# Patient Record
Sex: Female | Born: 1975 | Race: Black or African American | Hispanic: No | Smoking: Never smoker
Health system: Southern US, Community
[De-identification: ages and names within clinical notes are randomized; demographics above are authoritative.]

## PROBLEM LIST (undated history)

## (undated) ENCOUNTER — Inpatient Hospital Stay (HOSPITAL_COMMUNITY): Payer: Self-pay

## (undated) DIAGNOSIS — R519 Headache, unspecified: Secondary | ICD-10-CM

## (undated) DIAGNOSIS — R51 Headache: Secondary | ICD-10-CM

## (undated) DIAGNOSIS — N979 Female infertility, unspecified: Secondary | ICD-10-CM

## (undated) DIAGNOSIS — Z973 Presence of spectacles and contact lenses: Secondary | ICD-10-CM

## (undated) DIAGNOSIS — D649 Anemia, unspecified: Secondary | ICD-10-CM

## (undated) DIAGNOSIS — O021 Missed abortion: Secondary | ICD-10-CM

## (undated) DIAGNOSIS — A4902 Methicillin resistant Staphylococcus aureus infection, unspecified site: Secondary | ICD-10-CM

## (undated) DIAGNOSIS — B999 Unspecified infectious disease: Secondary | ICD-10-CM

## (undated) DIAGNOSIS — N7093 Salpingitis and oophoritis, unspecified: Secondary | ICD-10-CM

## (undated) HISTORY — PX: SALPINGOOPHORECTOMY: SHX82

## (undated) HISTORY — PX: WISDOM TOOTH EXTRACTION: SHX21

---

## 2011-11-17 ENCOUNTER — Ambulatory Visit (INDEPENDENT_AMBULATORY_CARE_PROVIDER_SITE_OTHER): Payer: BC Managed Care – PPO | Admitting: Medical

## 2011-11-17 ENCOUNTER — Encounter: Payer: Self-pay | Admitting: Medical

## 2011-11-17 VITALS — BP 110/70 | HR 68 | Temp 98.1°F | Ht 65.0 in | Wt 187.0 lb

## 2011-11-17 DIAGNOSIS — L089 Local infection of the skin and subcutaneous tissue, unspecified: Secondary | ICD-10-CM | POA: Insufficient documentation

## 2011-11-17 DIAGNOSIS — L723 Sebaceous cyst: Secondary | ICD-10-CM

## 2011-11-17 MED ORDER — CHLORHEXIDINE GLUCONATE 4 % EX SOLN: 1.0000 "application " | CUTANEOUS | Status: DC

## 2011-11-17 MED ORDER — DOXYCYCLINE HYCLATE 100 MG PO TABS: 100.0000 mg | ORAL_TABLET | Freq: Two times a day (BID) | ORAL | Status: AC

## 2011-11-17 NOTE — Progress Notes (Signed)
Subjective:   HPI  Kathryn Lara is a 36 y.o. female who presents as a new patient today.  She is here with her husband today.  She mainly has one complaint today and that is a possible boil under her right arm.  She notes history of prior boil that was positive for MRSA in the remote past.  This boil ended up rupturing on its own.  The current lesion has been there about 2 weeks but is worsening, is hard, tender, but no warmth or drainage.   No other aggravating or relieving factors.  She denies breast lesions, does check her breasts regularly, no concerns for breast lump.  No other c/o.  The following portions of the patient's history were reviewed and updated as appropriate: allergies, current medications, past family history, past medical history, past social history, past surgical history and problem list.  History reviewed. No pertinent past medical history.  Allergies  Allergen Reactions  . Septra (Bactrim) Hives    No current outpatient prescriptions on file prior to visit.     Review of Systems Constitutional: denies fever, chills, sweats, unexpected weight change Gastroenterology: denies abdominal pain, nausea, vomiting, diarrhea, constipation Hematology: denies bleeding or bruising problems Musculoskeletal: denies arthralgias, myalgias, joint swelling, back pain Neurology: no headache, weakness, tingling, numbness      Objective:   Physical Exam  General appearance: alert, no distress, WD/WN, black female Right axilla anteriorly with small 1 cm somewhat round/oval nodule that seems cystlike, but may have slight fluctuance in the middle. The area seems slightly indurated, tender, but no warmth or erythema.  Assessment and Plan :     Encounter Diagnosis  Name Primary?  . Infected cyst of skin Yes    The current lesion of concern is small but tender. Differential includes early abscess versus infected cyst versus other lump.  Given her history of MRSA infection,  we will treat empirically for an infected cyst. Prescription today for doxycycline, refilled Hibiclens wash that she has used in the past.  Discussed possible signs that would indicate worsening infection. She will call return if worse. Likewise, if this lesion is just not healing or getting bigger she will call or return.

## 2011-11-17 NOTE — Patient Instructions (Signed)
Abscess An abscess (boil or furuncle) is an infected area that contains a collection of pus.  SYMPTOMS Signs and symptoms of an abscess include pain, tenderness, redness, or hardness. You may feel a moveable soft area under your skin. An abscess can occur anywhere in the body.  TREATMENT  A surgical cut (incision) may be made over your abscess to drain the pus. Gauze may be packed into the space or a drain may be looped through the abscess cavity (pocket). This provides a drain that will allow the cavity to heal from the inside outwards. The abscess may be painful for a few days, but should feel much better if it was drained.  Your abscess, if seen early, may not have localized and may not have been drained. If not, another appointment may be required if it does not get better on its own or with medications. HOME CARE INSTRUCTIONS   Only take over-the-counter or prescription medicines for pain, discomfort, or fever as directed by your caregiver.   Take your antibiotics as directed if they were prescribed. Finish them even if you start to feel better.   Keep the skin and clothes clean around your abscess.   If the abscess was drained, you will need to use gauze dressing to collect any draining pus. Dressings will typically need to be changed 3 or more times a day.   The infection may spread by skin contact with others. Avoid skin contact as much as possible.   Practice good hygiene. This includes regular hand washing, cover any draining skin lesions, and do not share personal care items.   If you participate in sports, do not share athletic equipment, towels, whirlpools, or personal care items. Shower after every practice or tournament.   If a draining area cannot be adequately covered:   Do not participate in sports.   Children should not participate in day care until the wound has healed or drainage stops.   If your caregiver has given you a follow-up appointment, it is very important  to keep that appointment. Not keeping the appointment could result in a much worse infection, chronic or permanent injury, pain, and disability. If there is any problem keeping the appointment, you must call back to this facility for assistance.  SEEK MEDICAL CARE IF:   You develop increased pain, swelling, redness, drainage, or bleeding in the wound site.   You develop signs of generalized infection including muscle aches, chills, fever, or a general ill feeling.   You have an oral temperature above 102 F (38.9 C).  MAKE SURE YOU:   Understand these instructions.   Will watch your condition.   Will get help right away if you are not doing well or get worse.  Document Released: 07/15/2005 Document Revised: 06/17/2011 Document Reviewed: 05/08/2008 [facility] Patient Information 2012 [place], [place].  CELLULITIS Cellulitis is an infection of the skin and the tissue beneath it. The area is typically red and tender. It is caused by germs (bacteria) (usually staph or strep) that enter the body through cuts or sores. Cellulitis most commonly occurs in the arms or lower legs.  HOME CARE INSTRUCTIONS   If you are given a prescription for medications which kill germs (antibiotics), take as directed until finished.   If the infection is on the arm or leg, keep the limb elevated as able.   Use a warm cloth several times per day to relieve pain and encourage healing.   See your caregiver for recheck of the infected  site as directed if problems arise.   Only take over-the-counter or prescription medicines for pain, discomfort, or fever as directed by your caregiver.  SEEK MEDICAL CARE IF:   The area of redness (inflammation) is spreading, there are red streaks coming from the infected site, or if a part of the infection begins to turn dark in color.   The joint or bone underneath the infected skin becomes painful after the skin has healed.   The infection returns in the same or another area  after it seems to have gone away.   A boil or bump swells up. This may be an abscess.   New, unexplained problems such as pain or fever develop.  SEEK IMMEDIATE MEDICAL CARE IF:   You have a fever.   You or your child feels drowsy or lethargic.   There is vomiting, diarrhea, or lasting discomfort or feeling ill (malaise) with muscle aches and pains.  MAKE SURE YOU:   Understand these instructions.   Will watch your condition.   Will get help right away if you are not doing well or get worse.  Document Released: 07/15/2005 Document Revised: 06/17/2011 Document Reviewed: 05/23/2008 [facility] Patient Information 2012 [place], [place].

## 2012-02-26 ENCOUNTER — Other Ambulatory Visit: Payer: Self-pay | Admitting: Medical

## 2012-02-26 ENCOUNTER — Ambulatory Visit (INDEPENDENT_AMBULATORY_CARE_PROVIDER_SITE_OTHER): Payer: BC Managed Care – PPO | Admitting: Medical

## 2012-02-26 ENCOUNTER — Encounter: Payer: Self-pay | Admitting: Medical

## 2012-02-26 VITALS — BP 100/68 | HR 68 | Temp 97.7°F | Resp 16 | Wt 200.0 lb

## 2012-02-26 DIAGNOSIS — IMO0002 Reserved for concepts with insufficient information to code with codable children: Secondary | ICD-10-CM

## 2012-02-26 DIAGNOSIS — L02411 Cutaneous abscess of right axilla: Secondary | ICD-10-CM

## 2012-02-26 MED ORDER — DOXYCYCLINE HYCLATE 100 MG PO TABS: 100.0000 mg | ORAL_TABLET | Freq: Two times a day (BID) | ORAL | Status: AC

## 2012-02-26 MED ORDER — HYDROCODONE-ACETAMINOPHEN 5-500 MG PO TABS: 1.0000 | ORAL_TABLET | Freq: Four times a day (QID) | ORAL | Status: AC | PRN

## 2012-02-26 NOTE — Progress Notes (Signed)
   Kathryn Lara is a 36 y.o. female who presents for evaluation of a probable cutaneous abscess. Lesion is located in the right axilla. Onset was a few days ago. Symptoms have gradually worsened.  Saw me for similar few months ago.  Abscess has associated symptoms of nausea. 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 Patient does not have diabetes.   Objective:    There is an area characterized by a soft mobile subQ mass, induration, fluctuance, tenderness measuring 3 cm in greatest dimension. Location: right axilla, superolateral breast border.  Procedure Informed consent obtained.  The area was prepped in the usual manner and the skin overlying the abscess was anesthetized with 2.5cc of 1% lidocaine with epinephrine.  The area was sharply incised and approx 3ccs of purulent material was obtained.  Area was irrigated with high pressure saline. Packing was inserted. Wound was covered with sterile bandage.      Assessment:   Encounter Diagnosis  Name Primary?  [name] Abscess of axilla, right Yes     Plan:    Apply hot compresses frequently to promote drainage.  Wound culture sent. Oral antibiotics -- see med orders. I & D procedure as above. RTC in 3 days or PRN.

## 2012-02-29 ENCOUNTER — Encounter: Payer: Self-pay | Admitting: Medical

## 2012-02-29 ENCOUNTER — Ambulatory Visit (INDEPENDENT_AMBULATORY_CARE_PROVIDER_SITE_OTHER): Payer: BC Managed Care – PPO | Admitting: Medical

## 2012-02-29 VITALS — BP 120/70 | HR 68 | Temp 98.5°F | Resp 16 | Wt 203.0 lb

## 2012-02-29 DIAGNOSIS — IMO0002 Reserved for concepts with insufficient information to code with codable children: Secondary | ICD-10-CM

## 2012-02-29 DIAGNOSIS — L02419 Cutaneous abscess of limb, unspecified: Secondary | ICD-10-CM

## 2012-02-29 NOTE — Progress Notes (Signed)
Subjective:  Kathryn Lara is a 36 y.o. female who presents for recheck and packing removal.  I saw her Friday for right axillary abscess.  She is using the Doxycyline, having some nausea with the doxycyline though.  Feels improved some, changing dressing regularly.  Using the pain medication some.  No new c/o.    Objective:   Right axilla with 1cm oval area of induration, some tenderness, packing in place, slight drainage.  No erythema, no warmth.    Assessment:   Encounter Diagnosis  Name Primary?  [name] Abscess of axilla Yes     Plan:   Removed packing.  Improving.  Finish Doxycycline, advised c/t hot compresses frequently to promote drainage, Lortab prn, call if not resolving.  Still pending culture

## 2012-03-02 ENCOUNTER — Other Ambulatory Visit: Payer: Self-pay | Admitting: Medical

## 2012-03-02 LAB — WOUND CULTURE: Gram Stain: NONE SEEN

## 2012-03-02 MED ORDER — AMOXICILLIN-POT CLAVULANATE 875-125 MG PO TABS: 1.0000 | ORAL_TABLET | Freq: Two times a day (BID) | ORAL | Status: AC

## 2012-05-31 ENCOUNTER — Encounter: Payer: Self-pay | Admitting: Medical

## 2012-05-31 ENCOUNTER — Ambulatory Visit (INDEPENDENT_AMBULATORY_CARE_PROVIDER_SITE_OTHER): Payer: BC Managed Care – PPO | Admitting: Medical

## 2012-05-31 VITALS — BP 120/70 | HR 80 | Temp 98.3°F | Resp 16 | Wt 199.0 lb

## 2012-05-31 DIAGNOSIS — L2089 Other atopic dermatitis: Secondary | ICD-10-CM

## 2012-05-31 DIAGNOSIS — D229 Melanocytic nevi, unspecified: Secondary | ICD-10-CM

## 2012-05-31 DIAGNOSIS — D239 Other benign neoplasm of skin, unspecified: Secondary | ICD-10-CM

## 2012-05-31 DIAGNOSIS — L209 Atopic dermatitis, unspecified: Secondary | ICD-10-CM

## 2012-05-31 MED ORDER — TRIAMCINOLONE ACETONIDE 0.1 % EX CREA: TOPICAL_CREAM | Freq: Two times a day (BID) | CUTANEOUS | Status: AC

## 2012-05-31 NOTE — Progress Notes (Signed)
Subjective:   HPI  Kathryn Lara is a 36 y.o. female who presents for rash and moles.  She notes several day hx/o rash on neck, its itchy.  She is not sure what is triggering the rash, but wonders if it is the lab coat at work.  Works as a Water quality scientist.  The Dispensing optician don't seem to aggravate her but the laundered cloth ones do.  She has dreadlocks in the back of her hair and uses "natural" sprays of lavender and other sprays on the hair.  In general uses dove or caress soap for hygiene.   She notes no prior similar rash.   Using calamine lotion on the rash.  She also has several moles on her legs she wants me to look at.  None with recent growth or major changes.  No other aggravating or relieving factors.    No other c/o.  The following portions of the patient's history were reviewed and updated as appropriate: allergies, current medications, past family history, past medical history, past social history, past surgical history and problem list.  No past medical history on file.  Allergies  Allergen Reactions  . Septra (Bactrim) Hives     Review of Systems ROS reviewed and was negative other than noted in HPI or above.    Objective:   Physical Exam  General appearance: alert, no distress, WD/WN Skin: back of neck and lateral neck to lesser degree with rough, aggravated skin, a few whealed lesions, but mostly rough raised irregular rash, scattered suggestive of atopic dermatitis.  No erythema or scaling, no warmth, induration or fluctuance.    Lower legs, mostly left sided anteriorly and top of left foot with scattered flat, brown, mostly round, well defined lesions, mostly 2-4 mm diameter.  No particularly worrisome lesions.  The left dorsal foot lesion is similar but with some scaling.     Assessment and Plan :     Encounter Diagnoses  Name Primary?  [name] Atopic dermatitis Yes  . Skin moles    Atopic dermatitis - likely aggravated by the hair sprays or lab coat.  I  advised she stop the hair sprays for now, consider c/t using the disposable lab coats for now, begin daily moisturizing lotion, and if not improving, use the triamcinolone cream prescribed today.  Keep the neck clean, washing BID with mild soap and water.  Call/return if not resolving within a week or 2.   Skin moles - discussed the ABCDs of moles.  None worrisome today.  She will let me know if changing skin lesions.

## 2012-05-31 NOTE — Patient Instructions (Signed)
Begin a daily moisturizing lotion/emollient lotion on skin, particularly neck, legs and feet.   Wash the neck twice with mild soap and water.   Avoid the sprays on the hair for now to see if rash resolves.   If rash not improving in the next few days with the moisturizing lotion, then begin the steroid cream Triamcinolone.    Mole Moles are usually harmless skin spots. Moles can be different colors, from light brown to black. People usually develop many of them over their lifetime. Moles increase in number during the first 3 decades of life. Moles can be anywhere on the body. They may be flat or raised and sometimes they contain hair. Although moles can change over time, they only rarely turn into skin cancer. The moles that do not turn into cancer (benign) are usually:  Small (the size of a pencil eraser or less).   One color.   Smooth.   Have a uniform border.   Round and oval and do not change much in appearance over time.  Malignant melanoma is a skin cancer that starts like a mole. Moles that were present at birth are more likely to turn into a melanoma later in life especially if they were very large (larger than 7 inches [20 cm] in diameter) at birth. Melanomas are different from moles. Melanomas:  Have Borders that are more ragged.   Have more than one color (red, white or blue) in addition to brown or black.   Are usually bigger.  Years of sun exposure increases the risk for getting melanoma. It is important to look at your moles regularly so that you can notice any changes that may occur within anyone of them that make it different from your other moles on your body. Know the A, B, C, D's of your moles - a mole is more worrisome for a melanoma if: A: It is Asymmetrical (one half is different from the other half). B: The Borders are jagged, etched or irregular. C: The Color is uneven (shades of brown, tan and/or black).  D: The Diameter is greater than a quarter of an inch (6  mm). SEEK MEDICAL CARE IF:  Your mole bleeds or becomes an open sore or does not heal.   Your mole grows rapidly or changes color.   Your mole becomes irregular and bumpy.   Your mole becomes painful, itchy, tender or sensitive.  Document Released: 11/12/2004 Document Revised: 09/24/2011 Document Reviewed: 09/21/2008 [facility] Patient Information 2012 [place], [place].

## 2012-11-21 ENCOUNTER — Ambulatory Visit (INDEPENDENT_AMBULATORY_CARE_PROVIDER_SITE_OTHER): Payer: Self-pay | Admitting: Medical

## 2012-11-21 ENCOUNTER — Encounter: Payer: Self-pay | Admitting: Medical

## 2012-11-21 ENCOUNTER — Encounter: Payer: Self-pay | Admitting: Family Medicine

## 2012-11-21 VITALS — BP 100/60 | HR 68 | Temp 98.6°F | Resp 16 | Wt 181.0 lb

## 2012-11-21 DIAGNOSIS — R55 Syncope and collapse: Secondary | ICD-10-CM

## 2012-11-21 LAB — POCT URINE PREGNANCY: Preg Test, Ur: NEGATIVE

## 2012-11-21 LAB — POCT URINALYSIS DIPSTICK
Glucose, UA: NEGATIVE
Leukocytes, UA: NEGATIVE
Nitrite, UA: NEGATIVE
Urobilinogen, UA: NEGATIVE

## 2012-11-21 LAB — CBC WITH DIFFERENTIAL/PLATELET
Basophils Absolute: 0 10*3/uL (ref 0.0–0.1)
Basophils Relative: 1 % (ref 0–1)
Eosinophils Absolute: 0.3 10*3/uL (ref 0.0–0.7)
Eosinophils Relative: 3 % (ref 0–5)
HCT: 37.2 % (ref 36.0–46.0)
Hemoglobin: 12.5 g/dL (ref 12.0–15.0)
MCH: 29.8 pg (ref 26.0–34.0)
MCHC: 33.6 g/dL (ref 30.0–36.0)
Monocytes Absolute: 0.5 10*3/uL (ref 0.1–1.0)
Monocytes Relative: 6 % (ref 3–12)
RDW: 13.9 % (ref 11.5–15.5)

## 2012-11-21 LAB — TSH: TSH: 0.512 u[IU]/mL (ref 0.350–4.500)

## 2012-11-21 LAB — T3 UPTAKE: T3 Uptake: 36.9 % (ref 22.5–37.0)

## 2012-11-21 LAB — T4: T4, Total: 7.6 ug/dL (ref 5.0–12.5)

## 2012-11-21 NOTE — Progress Notes (Signed)
Subjective:  Kathryn Lara is a 37 y.o. female who presents for c/o "fainting."  Had been in usual state of health yesterday until the evening.  She notes that she fainted yesterday evening about 7pm.   She had been standing, felt a little nauseated and dizzy, tried to go sit down but didn't make.  She was at aunts house when this happened, at a [employer] party.  Brother in Social worker and sister in law witnessed.  People that witnessed the event said she fell down on her back, was "out" 30-60 seconds, made a snoring noise, was sweaty and when she came to was nauseated and sweaty.  They took her to a chair to rest.   EMS was called, EMS came out, checked BP and glucose, said her glucose was ok, but advised that her BP dropped with orthostatic vitals.   They advised her not to drive and come to be evaluated by primary provider.  Before the faint, she felt nauseated and light headed.   She had been drinking some wine prior.  She had eaten about 15 minutes prior to this event, but had only eaten a few crackers and some water the rest of the day prior.  The people witnessed said no seizure.  She does not remember anything during the 30-60 seconds.  She did not quit breathing when out.  Denies head injury or other pains today.   Denies prior similar problem.  She is on her period, not sure if this plays a role.  LMP prior was 10/23/12. She and husband are trying to get pregnant.  No other aggravating or relieving factors.    No other c/o.  The following portions of the patient's history were reviewed and updated as appropriate: allergies, current medications, past family history, past medical history, past social history, past surgical history and problem list.  No past medical history on file.  Review of Systems Constitutional: -fever, -chills, +sweats, -unexpected weight change,-fatigue ENT: -runny nose, -ear pain, -sore throat Cardiology:  -chest pain, -palpitations, -edema Respiratory: -cough, -shortness  of breath, -wheezing Gastroenterology: -abdominal pain, +nausea, -vomiting, -diarrhea, -constipation  Hematology: -bleeding or bruising problems Musculoskeletal: -arthralgias, -myalgias, -joint swelling, -back pain Ophthalmology: -vision changes Urology: -dysuria, -difficulty urinating, -hematuria, -urinary frequency, -urgency Neurology: -headache, -weakness, -tingling, -numbness    Objective: Physical Exam  Vital signs reviewed  General appearance: alert, no distress, WD/WN, AA female HEENT: normocephalic, sclerae anicteric, conjunctiva pink and moist, TMs pearly, nares patent, no discharge or erythema, pharynx normal Oral cavity: MMM, no lesions Neck: supple, no lymphadenopathy, no thyromegaly, no masses, no bruits Heart: RRR, normal S1, S2, no murmurs Lungs: CTA bilaterally, no wheezes, rhonchi, or rales Abdomen: +bs, soft, non tender, non distended, no masses, no hepatomegaly, no splenomegaly Pulses: 2+ radial pulses, 2+ pedal pulses, normal cap refill Ext: no edema Neuro: CN2-12 intact, A&O x3, romberg negative, heel to toe normal, no cerebellar signs, DTRs, strength and sensation normal.  nonfocal  Orthostatic vitals normal today.   Adult ECG Report  Indication: syncope  Rate: 51 bpm  Rhythm: sinus bradycardia  QRS Axis: -4 degrees  PR Interval:  QRS Duration: 78ms  QTc:  Conduction Disturbances: sinus arrhythmia  Other Abnormalities: none  Patient's cardiac risk factors are: obesity (BMI >= 30 kg/m2).  EKG comparison: none  Narrative Interpretation: sinus bradycardia with sinus arrhythmia   Assessment: Encounter Diagnosis  Name Primary?  . Syncope Yes    Plan: Discussed symptoms.  Episodes seems most consistent with vasovagal  syncope, although the only trigger may be from hypovolemia given that she had not had much to eat or drink.   Glucose was reportedly normal.  No obvious seizure.  At this point I reviewed her EKG here today, and we will get  some baseline labs to rule out other causes.  Advised she hydrate well daily, 5-6 glasses of water daily, don't skip meals, eat protein and carbs tthroughoutthe day.  F/u ppendinglabs.   Advised she not drive until we have results.   Gave note for work.

## 2012-11-22 LAB — COMPREHENSIVE METABOLIC PANEL
BUN: 11 mg/dL (ref 6–23)
CO2: 28 mEq/L (ref 19–32)
Creat: 0.76 mg/dL (ref 0.50–1.10)
Glucose, Bld: 85 mg/dL (ref 70–99)
Total Bilirubin: 0.6 mg/dL (ref 0.3–1.2)
Total Protein: 6.5 g/dL (ref 6.0–8.3)

## 2012-11-22 LAB — LIPID PANEL
Cholesterol: 125 mg/dL (ref 0–200)
LDL Cholesterol: 70 mg/dL (ref 0–99)
Triglycerides: 42 mg/dL (ref <150)

## 2012-11-25 NOTE — Addendum Note (Signed)
Addended by: Jac Canavan on: 11/25/2012 07:38 PM   Modules accepted: Orders

## 2012-11-28 ENCOUNTER — Telehealth: Payer: Self-pay | Admitting: Family Medicine

## 2012-11-28 NOTE — Telephone Encounter (Signed)
I just spoke with the patient today. The answers to the question is in your box. CLS

## 2012-11-28 NOTE — Telephone Encounter (Signed)
Message copied by Janeice Robinson on Mon Nov 28, 2012 12:38 PM ------      Message from: Jac Canavan      Created: Fri Nov 25, 2012  7:40 PM       Did you get a hold of her?   Did you see the prior msg?  i haven't heard back?              Pending those questions, I may refer to cardiology given EKG findings, syncope symptom ------

## 2012-11-29 NOTE — Telephone Encounter (Signed)
Her faint episode is still somewhat unexplained.  This may have just been due to vasovagal event and dehydration since she had not drank or had eaten much all day that day.  However, I can't rule out other causes.  I am glad she has had no other events.   Nevertheless, given the syncope episode, I would recommend a referral to cardiology to evaluate this further.   She has a slower than normal heart rate and abnormal EKG.  Holter testing would be appropriate to evaluate for abnormal rhythms of the heart.  If agreeable refer.

## 2012-11-29 NOTE — Telephone Encounter (Signed)
Per the patients request, I left a detailed message on her voice mail in reference to Kristian Covey PA-C recommendations for her care. CLS

## 2013-06-28 ENCOUNTER — Encounter: Payer: Self-pay | Admitting: Medical

## 2013-06-28 ENCOUNTER — Ambulatory Visit (INDEPENDENT_AMBULATORY_CARE_PROVIDER_SITE_OTHER): Payer: 59 | Admitting: Medical

## 2013-06-28 VITALS — BP 110/70 | HR 68 | Temp 98.0°F | Resp 16 | Wt 172.0 lb

## 2013-06-28 DIAGNOSIS — K921 Melena: Secondary | ICD-10-CM

## 2013-06-28 DIAGNOSIS — R350 Frequency of micturition: Secondary | ICD-10-CM

## 2013-06-28 DIAGNOSIS — K5900 Constipation, unspecified: Secondary | ICD-10-CM

## 2013-06-28 DIAGNOSIS — K649 Unspecified hemorrhoids: Secondary | ICD-10-CM

## 2013-06-28 LAB — POCT URINALYSIS DIPSTICK
Protein, UA: NEGATIVE
Spec Grav, UA: 1.02
Urobilinogen, UA: NEGATIVE

## 2013-06-28 MED ORDER — HYDROCORTISONE 2.5 % RE CREA: TOPICAL_CREAM | Freq: Two times a day (BID) | RECTAL | Status: DC

## 2013-06-28 NOTE — Progress Notes (Signed)
Subjective: Here for c/o blood in stool . She notes several times prior has seen blood in stool, minimal bright red amounts.   None in a while until today.   This morning felt pressure to defecate, but no stool came out, just noticed bright red blood on toilet paper.  She saw none in the toilet or mixed in with stool.  otherwise she is in her normal state of health.  She notes having BM daily, had BM last night, but often has to strain or push hard.  Sometimes stool is like pellets, sometimes solid, other times soft.  She drinks some water, but probably "not enough."  She is not sure about her fiber intake.  In the past has used miralax, has seen doctor for this prior.  Often gets abdominal cramps, burning in stomach, sometimes nausea, frequent belching.  She is trying to get pregnant, taking prenatal vitamins.  MGF had stomach issues, but no prior endoscopies, no other family hx/o bowel issues.   History reviewed. No pertinent past medical history. Family History  Problem Relation Age of Onset  . Heart disease Neg Hx   . Stroke Neg Hx    ROS as in subjective  Objective: Filed Vitals:   06/28/13 1504  BP: 110/70  Pulse: 68  Temp:   Resp:    Reviewed orthostatic vital signs.  General appearance: alert, no distress, WD/W Neck: supple, no lymphadenopathy, no thyromegaly, no masses Heart: RRR, normal S1, S2, no murmurs Lungs: CTA bilaterally, no wheezes, rhonchi, or rales Abdomen: +bs, soft, non tender, non distended, no masses, no hepatomegaly, no splenomegaly Pulses: 2+ symmetric Ext: no edema DRE: anus with posterior moderate size hemorrhoid and smaller anterior hemorrhoid, non thrombosed, anus normal tone, palpable internal hemorrhoids, and +hemoccult stool.  Exam chaperoned by nurse   Assessment: Encounter Diagnoses  Name Primary?  . Blood in stool Yes  . Hemorrhoid   . Urinary frequency   . Unspecified constipation      Plan: Orthostatics unremarkable.  Given the one  small sighting of blood and +exam with hemorrhoids and occult +blood, I suspect only hemorrhoid etiology.   At this point advised increased fiber and water intake, can use short term Proctosol HC cream for now, sitz baths if needed, consider daily fiber supplement.  Follow-up if continued bleeding, worse symptoms, otherwise f/u20mo

## 2013-06-29 ENCOUNTER — Telehealth: Payer: Self-pay | Admitting: Internal Medicine

## 2013-06-29 NOTE — Telephone Encounter (Signed)
Message copied by Joslyn Hy on Thu Jun 29, 2013 10:05 AM ------      Message from: Jac Canavan      Created: Wed Jun 28, 2013  9:21 PM       Have her f/u in 66mo if any continued blood in stool or no improvement despite the recommendations today ------

## 2013-06-29 NOTE — Telephone Encounter (Signed)
Called and left message on pt vm that if she is still having trouble and no improvement to follow-up in 1 month

## 2014-03-09 HISTORY — PX: OTHER SURGICAL HISTORY: SHX169

## 2014-04-13 ENCOUNTER — Encounter (HOSPITAL_BASED_OUTPATIENT_CLINIC_OR_DEPARTMENT_OTHER): Payer: Self-pay | Admitting: *Deleted

## 2014-04-16 ENCOUNTER — Encounter (HOSPITAL_BASED_OUTPATIENT_CLINIC_OR_DEPARTMENT_OTHER): Payer: Self-pay | Admitting: *Deleted

## 2014-04-16 NOTE — Progress Notes (Signed)
NPO AFTER MN.  ARRIVE AT 1000.  NEEDS HG.  

## 2014-04-17 ENCOUNTER — Encounter (HOSPITAL_BASED_OUTPATIENT_CLINIC_OR_DEPARTMENT_OTHER)
Admission: RE | Disposition: A | Discharge status: Home / Self Care | Payer: Self-pay | Source: Ambulatory Visit | Attending: Obstetrics and Gynecology

## 2014-04-17 ENCOUNTER — Encounter (HOSPITAL_BASED_OUTPATIENT_CLINIC_OR_DEPARTMENT_OTHER): Payer: Self-pay | Admitting: Anesthesiology

## 2014-04-17 ENCOUNTER — Encounter (HOSPITAL_BASED_OUTPATIENT_CLINIC_OR_DEPARTMENT_OTHER): Payer: BC Managed Care – PPO | Admitting: Anesthesiology

## 2014-04-17 ENCOUNTER — Ambulatory Visit (HOSPITAL_BASED_OUTPATIENT_CLINIC_OR_DEPARTMENT_OTHER)
Admission: RE | Admit: 2014-04-17 | Discharge: 2014-04-17 | Disposition: A | Discharge status: Home / Self Care | Payer: BC Managed Care – PPO | Source: Ambulatory Visit | Attending: Obstetrics and Gynecology | Admitting: Obstetrics and Gynecology

## 2014-04-17 ENCOUNTER — Ambulatory Visit (HOSPITAL_BASED_OUTPATIENT_CLINIC_OR_DEPARTMENT_OTHER): Payer: BC Managed Care – PPO | Admitting: Anesthesiology

## 2014-04-17 DIAGNOSIS — O021 Missed abortion: Secondary | ICD-10-CM | POA: Insufficient documentation

## 2014-04-17 DIAGNOSIS — Z87891 Personal history of nicotine dependence: Secondary | ICD-10-CM | POA: Insufficient documentation

## 2014-04-17 DIAGNOSIS — Z882 Allergy status to sulfonamides status: Secondary | ICD-10-CM | POA: Insufficient documentation

## 2014-04-17 DIAGNOSIS — Z79899 Other long term (current) drug therapy: Secondary | ICD-10-CM | POA: Insufficient documentation

## 2014-04-17 HISTORY — PX: DILATION AND CURETTAGE OF UTERUS: SHX78

## 2014-04-17 HISTORY — DX: Missed abortion: O02.1

## 2014-04-17 HISTORY — DX: Female infertility, unspecified: N97.9

## 2014-04-17 HISTORY — DX: Presence of spectacles and contact lenses: Z97.3

## 2014-04-17 LAB — POCT HEMOGLOBIN-HEMACUE: Hemoglobin: 11.2 g/dL — ABNORMAL LOW (ref 12.0–15.0)

## 2014-04-17 SURGERY — DILATION AND CURETTAGE
Anesthesia: General | Site: Vagina

## 2014-04-17 MED ORDER — LIDOCAINE HCL (CARDIAC) 20 MG/ML IV SOLN
INTRAVENOUS | Status: DC | PRN
  Administered 2014-04-17: 75 mg via INTRAVENOUS

## 2014-04-17 MED ORDER — FENTANYL CITRATE 0.05 MG/ML IJ SOLN
INTRAMUSCULAR | Status: AC
  Filled 2014-04-17: qty 4

## 2014-04-17 MED ORDER — FENTANYL CITRATE 0.05 MG/ML IJ SOLN
INTRAMUSCULAR | Status: DC | PRN
  Administered 2014-04-17: 100 ug via INTRAVENOUS

## 2014-04-17 MED ORDER — ONDANSETRON HCL 4 MG/2ML IJ SOLN
INTRAMUSCULAR | Status: DC | PRN
  Administered 2014-04-17: 4 mg via INTRAVENOUS

## 2014-04-17 MED ORDER — SODIUM CHLORIDE 0.9 % IR SOLN
Status: DC | PRN
  Administered 2014-04-17: 500 mL

## 2014-04-17 MED ORDER — DEXAMETHASONE SODIUM PHOSPHATE 10 MG/ML IJ SOLN
INTRAMUSCULAR | Status: DC | PRN
  Administered 2014-04-17: 10 mg via INTRAVENOUS

## 2014-04-17 MED ORDER — PROPOFOL 10 MG/ML IV BOLUS
INTRAVENOUS | Status: DC | PRN
  Administered 2014-04-17: 200 mg via INTRAVENOUS
  Administered 2014-04-17: 100 mg via INTRAVENOUS

## 2014-04-17 MED ORDER — FENTANYL CITRATE 0.05 MG/ML IJ SOLN
25.0000 ug | INTRAMUSCULAR | Status: DC | PRN
  Filled 2014-04-17: qty 1

## 2014-04-17 MED ORDER — CEFAZOLIN SODIUM-DEXTROSE 2-3 GM-% IV SOLR
2.0000 g | INTRAVENOUS | Status: AC
Start: 2014-04-17 — End: 2014-04-17
  Administered 2014-04-17: 2 g via INTRAVENOUS
  Filled 2014-04-17: qty 50

## 2014-04-17 MED ORDER — LACTATED RINGERS IV SOLN
INTRAVENOUS | Status: DC
  Administered 2014-04-17 (×2): via INTRAVENOUS
  Filled 2014-04-17: qty 1000

## 2014-04-17 MED ORDER — MINERAL OIL RE ENEM: 1.0000 | ENEMA | Freq: Once | RECTAL | Status: DC

## 2014-04-17 MED ORDER — FLEET ENEMA 7-19 GM/118ML RE ENEM
1.0000 | ENEMA | Freq: Once | RECTAL | Status: AC
  Administered 2014-04-17: 1 via RECTAL
  Filled 2014-04-17: qty 1

## 2014-04-17 MED ORDER — STERILE WATER FOR IRRIGATION IR SOLN
Status: DC | PRN
  Administered 2014-04-17: 500 mL

## 2014-04-17 MED ORDER — MIDAZOLAM HCL 2 MG/2ML IJ SOLN
INTRAMUSCULAR | Status: AC
  Filled 2014-04-17: qty 2

## 2014-04-17 MED ORDER — MIDAZOLAM HCL 5 MG/5ML IJ SOLN
INTRAMUSCULAR | Status: DC | PRN
  Administered 2014-04-17: 2 mg via INTRAVENOUS

## 2014-04-17 MED ORDER — LACTATED RINGERS IV SOLN
INTRAVENOUS | Status: DC
  Filled 2014-04-17: qty 1000

## 2014-04-17 SURGICAL SUPPLY — 21 items
CANISTER SUCTION 2500CC (MISCELLANEOUS) ×3 IMPLANT
CANNULA CURETTE W/SYR 7 (CANNULA) ×2 IMPLANT
CANNULA CURETTE W/SYR 7MM (CANNULA) ×1
CATH ROBINSON RED A/P 16FR (CATHETERS) ×3 IMPLANT
COVER TABLE BACK 60X90 (DRAPES) ×3 IMPLANT
DRAPE CAMERA CLOSED 9X96 (DRAPES) ×3 IMPLANT
DRAPE HYSTEROSCOPY (DRAPE) ×3 IMPLANT
DRAPE LG THREE QUARTER DISP (DRAPES) ×6 IMPLANT
DRESSING TELFA ISLAND 4X8 (GAUZE/BANDAGES/DRESSINGS) ×3 IMPLANT
GLOVE BIO SURGEON STRL SZ8 (GLOVE) ×3 IMPLANT
GLOVE BIOGEL PI IND STRL 8.5 (GLOVE) ×1 IMPLANT
GLOVE BIOGEL PI INDICATOR 8.5 (GLOVE) ×2
KIT BERKELEY 1ST TRIMESTER 3/8 (MISCELLANEOUS) IMPLANT
LEGGING LITHOTOMY PAIR STRL (DRAPES) ×3 IMPLANT
NS IRRIG 500ML POUR BTL (IV SOLUTION) ×3 IMPLANT
PACK BASIN DAY SURGERY FS (CUSTOM PROCEDURE TRAY) ×3 IMPLANT
PAD OB MATERNITY 4.3X12.25 (PERSONAL CARE ITEMS) ×3 IMPLANT
SET BERKELEY SUCTION TUBING (SUCTIONS) IMPLANT
TOWEL OR 17X24 6PK STRL BLUE (TOWEL DISPOSABLE) ×6 IMPLANT
TRAY DSU PREP LF (CUSTOM PROCEDURE TRAY) ×3 IMPLANT
WATER STERILE IRR 500ML POUR (IV SOLUTION) ×3 IMPLANT

## 2014-04-17 NOTE — Op Note (Signed)
OPERATIVE NOTE  Preoperative diagnosis: Missed abortion at [redacted]wk GA  Postoperative diagnosis: Missed abortion at 7wk  Procedure: Suction D. and C.  Anesthesia: Modified anesthesia care  Surgeon: Governor Specking, MD  Complications: None   Estimated blood loss: 20 mL  Description of the procedure: Patient was placed in lithotomy position modified anesthesia care was started with midazolam, fentanyl and propofol. 2 g of cefazolin were given intravenously for prophylaxis. She she was prepped and draped in a sterile manner. Exam under anesthesia showed uterus to be 7-8 weeks size, soft, anteverted. The cervix was closed. There were no adnexal masses. Large amount of fecal impaction was noted in rectum.  A vaginal speculum was inserted. Anterior cervical lip was grasped with a tenaculum. A size 7 suction curet attached to a Handyvac manual evacuation device was used to reach the implantation site and suction rotated onto cul-de-sac was taken up by the curet. Uterus sounded to 12 cm. At this point the specimen containing jar was detached from the line and a new specimen jar was connected and the rest of the decidual tissue and uterine contents were curetted. A gritty feeling was obtained and the procedure was terminated.  The patient tolerated the procedure well and was transferred to recovery in satisfactory condition. The tissue was carefully separated and the products of conception were sent to chromosome analysis to [place]. Rest of the tissue was sent to pathology.  Governor Specking

## 2014-04-17 NOTE — Anesthesia Preprocedure Evaluation (Addendum)
Anesthesia Evaluation  Patient identified by MRN, date of birth, ID band Patient awake    Reviewed: Allergy & Precautions, H&P , NPO status , Patient's Chart, lab work & pertinent test results  Airway Mallampati: II TM Distance: >3 FB Neck ROM: full    Dental no notable dental hx. (+) Teeth Intact, Dental Advisory Given   Pulmonary neg pulmonary ROS, former smoker,  breath sounds clear to auscultation  Pulmonary exam normal       Cardiovascular Exercise Tolerance: Good negative cardio ROS  Rhythm:regular Rate:Normal     Neuro/Psych negative neurological ROS  negative psych ROS   GI/Hepatic negative GI ROS, Neg liver ROS,   Endo/Other  negative endocrine ROS  Renal/GU negative Renal ROS  negative genitourinary   Musculoskeletal   Abdominal   Peds  Hematology negative hematology ROS (+)   Anesthesia Other Findings   Reproductive/Obstetrics negative OB ROS                        Anesthesia Physical Anesthesia Plan  ASA: II  Anesthesia Plan: General   Post-op Pain Management:    Induction: Intravenous  Airway Management Planned: LMA  Additional Equipment:   Intra-op Plan:   Post-operative Plan:   Informed Consent: I have reviewed the patients History and Physical, chart, labs and discussed the procedure including the risks, benefits and alternatives for the proposed anesthesia with the patient or authorized representative who has indicated his/her understanding and acceptance.   Dental Advisory Given  Plan Discussed with: CRNA, Surgeon and Anesthesiologist  Anesthesia Plan Comments:        Anesthesia Quick Evaluation

## 2014-04-17 NOTE — Discharge Instructions (Signed)

## 2014-04-17 NOTE — Transfer of Care (Signed)
Immediate Anesthesia Transfer of Care Note  Patient: Kathryn Lara  Procedure(s) Performed: Procedure(s): SUCTION DILATATION AND CURETTAGE (N/A)  Patient Location: PACU  Anesthesia Type:General  Level of Consciousness: awake, alert , oriented and patient cooperative  Airway & Oxygen Therapy: Patient Spontanous Breathing and Patient connected to nasal cannula oxygen  Post-op Assessment: Report given to PACU RN and Post -op Vital signs reviewed and stable  Post vital signs: Reviewed and stable  Complications: No apparent anesthesia complications

## 2014-04-17 NOTE — Anesthesia Procedure Notes (Signed)
Procedure Name: LMA Insertion Date/Time: 04/17/2014 1:30 PM Performed by: Wanita Chamberlain Pre-anesthesia Checklist: Patient identified, Timeout performed, Emergency Drugs available, Suction available and Patient being monitored Patient Re-evaluated:Patient Re-evaluated prior to inductionOxygen Delivery Method: Circle system utilized Preoxygenation: Pre-oxygenation with 100% oxygen Intubation Type: IV induction Ventilation: Mask ventilation without difficulty LMA: LMA inserted LMA Size: 4.0 Number of attempts: 1 Placement Confirmation: breath sounds checked- equal and bilateral and positive ETCO2 Tube secured with: Tape

## 2014-04-17 NOTE — H&P (Signed)
Kathryn Lara is a 38 y.o. 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 She conceived from first ICSI /ET cycle but unfortunately was dx'd with missed ab at 6 wk.   Pertinent Gynecological History: Menses: flow is excessive with use of 3 pads or tampons on heaviest days Bleeding: dysfunctional uterine bleeding Contraception: none DES exposure: denies Blood transfusions: none Sexually transmitted diseases: no past history Last mammogram: normal Last pap: normal  OB History: 1 SVD  Menstrual History: Menarche age: 57 No LMP recorded.    Past Medical History  Diagnosis Date  . Missed abortion   . Wears glasses   . Infertility, female                     Past Surgical History  Procedure Laterality Date  . Wisdom tooth extraction    . Ovarian egg retrieval  03-09-2014             Family History  Problem Relation Age of Onset  . Heart disease Neg Hx   . Stroke Neg Hx    No hereditary disease.  No cancer of breast, ovary, uterus. No cutaneous leiomyomatosis or renal cell carcinoma.  History   Social History  . Marital Status: Married    Spouse Name: N/A    Number of Children: N/A  . Years of Education: N/A   Occupational History  . phlebotomist    Social History Main Topics  . Smoking status: Former Smoker    Types: Cigarettes    Quit date: 01/14/2014  . Smokeless tobacco: Never Used     Comment: WAS A SOCIAL SMOKER  . Alcohol Use: No     Comment: OCCASIONAL  . Drug Use: No  . Sexual Activity: Not on file   Other Topics Concern  . Not on file   Social History Narrative   Married, has one daughter, exercises some, not religious    Allergies  Allergen Reactions  . Sulfa Antibiotics Hives    No current facility-administered medications on file prior to encounter.   Current Outpatient Prescriptions on File Prior to Encounter  Medication Sig Dispense Refill  . Prenatal Vit-Fe Fumarate-FA (PRENATAL  MULTIVITAMIN) TABS tablet Take 1 tablet by mouth daily.          Review of Systems  Constitutional: Negative.   HENT: Negative.   Eyes: Negative.   Respiratory: Negative.   Cardiovascular: Negative.   Gastrointestinal: Negative.   Genitourinary: Negative.   Musculoskeletal: Negative.   Skin: Negative.   Neurological: Negative.   Endo/Heme/Allergies: Negative.   Psychiatric/Behavioral: Negative.      Physical Exam  BP 108/69  Pulse 94  Temp(Src) 97.7 F (36.5 C) (Oral)  Resp 16  Ht 5' 4.75" (1.645 m)  Wt 73.936 kg (163 lb)  BMI 27.32 kg/m2  SpO2 99%  LMP 02/22/2014 Constitutional: She is oriented to person, place, and time. She appears well-developed and well-nourished.  HENT:  Head: Normocephalic and atraumatic.  Nose: Nose normal.  Mouth/Throat: Oropharynx is clear and moist. No oropharyngeal exudate.  Eyes: Conjunctivae normal and EOM are normal. Pupils are equal, round, and reactive to light. No scleral icterus.  Neck: Normal range of motion. Neck supple. No tracheal deviation present. No thyromegaly present.  Cardiovascular: Normal rate.   Respiratory: Effort normal and breath sounds normal.  GI: Soft. Bowel sounds are normal. She exhibits no distension and no mass. There is no tenderness.  Lymphadenopathy:  She has no cervical adenopathy.  Neurological: She is alert and oriented to person, place, and time. She has normal reflexes.  Skin: Skin is warm.  Psychiatric: She has a normal mood and affect. Her behavior is normal. Judgment and thought content normal.    Assessment/Plan: Missed abortion at 6-7 wk for suction D&C and chromosomes. Procedure, its benefits and risks were reviewed with pt.   Governor Specking

## 2014-04-17 NOTE — Anesthesia Postprocedure Evaluation (Signed)
  Anesthesia Post-op Note  Patient: Kathryn Lara  Procedure(s) Performed: Procedure(s) (LRB): SUCTION DILATATION AND CURETTAGE (N/A)  Patient Location: PACU  Anesthesia Type: General  Level of Consciousness: awake and alert   Airway and Oxygen Therapy: Patient Spontanous Breathing  Post-op Pain: mild  Post-op Assessment: Post-op Vital signs reviewed, Patient's Cardiovascular Status Stable, Respiratory Function Stable, Patent Airway and No signs of Nausea or vomiting  Last Vitals:  Filed Vitals:   04/17/14 1430  BP: 100/49  Pulse: 83  Temp:   Resp: 18    Post-op Vital Signs: stable   Complications: No apparent anesthesia complications

## 2014-04-18 ENCOUNTER — Encounter (HOSPITAL_BASED_OUTPATIENT_CLINIC_OR_DEPARTMENT_OTHER): Payer: Self-pay | Admitting: Obstetrics and Gynecology

## 2014-04-30 ENCOUNTER — Inpatient Hospital Stay (HOSPITAL_COMMUNITY): Payer: BC Managed Care – PPO

## 2014-04-30 ENCOUNTER — Emergency Department (HOSPITAL_COMMUNITY): Payer: BC Managed Care – PPO

## 2014-04-30 ENCOUNTER — Encounter (HOSPITAL_COMMUNITY): Payer: Self-pay | Admitting: Emergency Medicine

## 2014-04-30 ENCOUNTER — Inpatient Hospital Stay (HOSPITAL_COMMUNITY)
Admission: EM | Admit: 2014-04-30 | Discharge: 2014-05-08 | DRG: 743 | Disposition: A | Discharge status: Home / Self Care | Payer: BC Managed Care – PPO | Attending: Obstetrics and Gynecology | Admitting: Obstetrics and Gynecology

## 2014-04-30 DIAGNOSIS — Z87891 Personal history of nicotine dependence: Secondary | ICD-10-CM

## 2014-04-30 DIAGNOSIS — R110 Nausea: Secondary | ICD-10-CM | POA: Diagnosis present

## 2014-04-30 DIAGNOSIS — R1030 Lower abdominal pain, unspecified: Secondary | ICD-10-CM

## 2014-04-30 DIAGNOSIS — N9489 Other specified conditions associated with female genital organs and menstrual cycle: Secondary | ICD-10-CM

## 2014-04-30 DIAGNOSIS — D72829 Elevated white blood cell count, unspecified: Secondary | ICD-10-CM

## 2014-04-30 DIAGNOSIS — K37 Unspecified appendicitis: Secondary | ICD-10-CM | POA: Diagnosis present

## 2014-04-30 DIAGNOSIS — D649 Anemia, unspecified: Secondary | ICD-10-CM | POA: Diagnosis present

## 2014-04-30 DIAGNOSIS — Z9889 Other specified postprocedural states: Secondary | ICD-10-CM

## 2014-04-30 DIAGNOSIS — N7093 Salpingitis and oophoritis, unspecified: Principal | ICD-10-CM | POA: Diagnosis present

## 2014-04-30 DIAGNOSIS — N739 Female pelvic inflammatory disease, unspecified: Secondary | ICD-10-CM | POA: Diagnosis present

## 2014-04-30 DIAGNOSIS — R109 Unspecified abdominal pain: Secondary | ICD-10-CM | POA: Diagnosis present

## 2014-04-30 LAB — CBC WITH DIFFERENTIAL/PLATELET
BASOS ABS: 0 10*3/uL (ref 0.0–0.1)
BASOS PCT: 0 % (ref 0–1)
EOS PCT: 0 % (ref 0–5)
Eosinophils Absolute: 0 10*3/uL (ref 0.0–0.7)
HCT: 33.4 % — ABNORMAL LOW (ref 36.0–46.0)
HEMOGLOBIN: 11.1 g/dL — AB (ref 12.0–15.0)
LYMPHS ABS: 1.4 10*3/uL (ref 0.7–4.0)
LYMPHS PCT: 6 % — AB (ref 12–46)
MCH: 29.4 pg (ref 26.0–34.0)
MCHC: 33.2 g/dL (ref 30.0–36.0)
MCV: 88.4 fL (ref 78.0–100.0)
MONOS PCT: 4 % (ref 3–12)
Monocytes Absolute: 0.9 10*3/uL (ref 0.1–1.0)
NEUTROS ABS: 20.8 10*3/uL — AB (ref 1.7–7.7)
Neutrophils Relative %: 90 % — ABNORMAL HIGH (ref 43–77)
Platelets: 363 10*3/uL (ref 150–400)
RBC: 3.78 MIL/uL — AB (ref 3.87–5.11)
RDW: 12.6 % (ref 11.5–15.5)
WBC: 23.1 10*3/uL — ABNORMAL HIGH (ref 4.0–10.5)

## 2014-04-30 LAB — URINALYSIS, ROUTINE W REFLEX MICROSCOPIC
BILIRUBIN URINE: NEGATIVE
Glucose, UA: NEGATIVE mg/dL
KETONES UR: 40 mg/dL — AB
Leukocytes, UA: NEGATIVE
Nitrite: NEGATIVE
Protein, ur: NEGATIVE mg/dL
UROBILINOGEN UA: 1 mg/dL (ref 0.0–1.0)
pH: 5 (ref 5.0–8.0)

## 2014-04-30 LAB — COMPREHENSIVE METABOLIC PANEL
ALT: 10 U/L (ref 0–35)
ANION GAP: 18 — AB (ref 5–15)
AST: 12 U/L (ref 0–37)
Albumin: 3.2 g/dL — ABNORMAL LOW (ref 3.5–5.2)
Alkaline Phosphatase: 89 U/L (ref 39–117)
BILIRUBIN TOTAL: 0.7 mg/dL (ref 0.3–1.2)
BUN: 7 mg/dL (ref 6–23)
CHLORIDE: 95 meq/L — AB (ref 96–112)
CO2: 24 meq/L (ref 19–32)
Calcium: 9.1 mg/dL (ref 8.4–10.5)
Creatinine, Ser: 0.94 mg/dL (ref 0.50–1.10)
GFR calc Af Amer: 88 mL/min — ABNORMAL LOW (ref >90)
GFR, EST NON AFRICAN AMERICAN: 76 mL/min — AB (ref >90)
Glucose, Bld: 139 mg/dL — ABNORMAL HIGH (ref 70–99)
POTASSIUM: 3.6 meq/L — AB (ref 3.7–5.3)
Sodium: 137 mEq/L (ref 137–147)
Total Protein: 7.7 g/dL (ref 6.0–8.3)

## 2014-04-30 LAB — URINE MICROSCOPIC-ADD ON

## 2014-04-30 LAB — HCG, QUANTITATIVE, PREGNANCY: HCG, BETA CHAIN, QUANT, S: 33 m[IU]/mL — AB (ref <5)

## 2014-04-30 LAB — I-STAT CG4 LACTIC ACID, ED: Lactic Acid, Venous: 1.4 mmol/L (ref 0.5–2.2)

## 2014-04-30 LAB — LIPASE, BLOOD: Lipase: 8 U/L — ABNORMAL LOW (ref 11–59)

## 2014-04-30 MED ORDER — ONDANSETRON HCL 4 MG/2ML IJ SOLN
4.0000 mg | Freq: Three times a day (TID) | INTRAMUSCULAR | Status: AC | PRN
  Administered 2014-04-30: 4 mg via INTRAVENOUS
  Filled 2014-04-30: qty 2

## 2014-04-30 MED ORDER — PIPERACILLIN-TAZOBACTAM 3.375 G IVPB
3.3750 g | Freq: Three times a day (TID) | INTRAVENOUS | Status: DC
Start: 2014-04-30 — End: 2014-05-04
  Administered 2014-04-30 – 2014-05-04 (×13): 3.375 g via INTRAVENOUS
  Filled 2014-04-30 (×15): qty 50

## 2014-04-30 MED ORDER — ONDANSETRON HCL 4 MG/2ML IJ SOLN
4.0000 mg | Freq: Four times a day (QID) | INTRAMUSCULAR | Status: DC | PRN
  Administered 2014-04-30 – 2014-05-04 (×10): 4 mg via INTRAVENOUS
  Filled 2014-04-30 (×11): qty 2

## 2014-04-30 MED ORDER — HYDROMORPHONE HCL 2 MG PO TABS
2.0000 mg | ORAL_TABLET | ORAL | Status: DC | PRN
  Administered 2014-04-30 – 2014-05-04 (×16): 2 mg via ORAL
  Filled 2014-04-30 (×17): qty 1

## 2014-04-30 MED ORDER — PRENATAL MULTIVITAMIN CH
1.0000 | ORAL_TABLET | Freq: Every day | ORAL | Status: DC
  Administered 2014-05-02 – 2014-05-03 (×2): 1 via ORAL
  Filled 2014-04-30 (×3): qty 1

## 2014-04-30 MED ORDER — SODIUM CHLORIDE 0.9 % IV SOLN
1000.0000 mL | Freq: Once | INTRAVENOUS | Status: AC
  Administered 2014-04-30: 1000 mL via INTRAVENOUS

## 2014-04-30 MED ORDER — SODIUM CHLORIDE 0.9 % IV SOLN: 1000.0000 mL | INTRAVENOUS | Status: DC

## 2014-04-30 MED ORDER — DEXTROSE 5 % IV SOLN
1.0000 g | Freq: Once | INTRAVENOUS | Status: AC
  Administered 2014-04-30: 1 g via INTRAVENOUS
  Filled 2014-04-30: qty 10

## 2014-04-30 MED ORDER — HYDROMORPHONE HCL PF 1 MG/ML IJ SOLN
1.0000 mg | INTRAMUSCULAR | Status: DC | PRN
  Administered 2014-04-30: 1 mg via INTRAVENOUS

## 2014-04-30 MED ORDER — ONDANSETRON HCL 4 MG/2ML IJ SOLN
4.0000 mg | Freq: Once | INTRAMUSCULAR | Status: AC
  Administered 2014-04-30: 4 mg via INTRAVENOUS
  Filled 2014-04-30: qty 2

## 2014-04-30 MED ORDER — IOHEXOL 300 MG/ML  SOLN
100.0000 mL | Freq: Once | INTRAMUSCULAR | Status: AC | PRN
Start: 2014-04-30 — End: 2014-04-30
  Administered 2014-04-30: 100 mL via INTRAVENOUS

## 2014-04-30 MED ORDER — HYDROMORPHONE HCL PF 1 MG/ML IJ SOLN
1.0000 mg | INTRAMUSCULAR | Status: DC | PRN
Start: 2014-04-30 — End: 2014-04-30
  Administered 2014-04-30: 1 mg via INTRAVENOUS
  Filled 2014-04-30 (×2): qty 1

## 2014-04-30 MED ORDER — SODIUM CHLORIDE 0.9 % IV SOLN
INTRAVENOUS | Status: AC
  Administered 2014-04-30: 08:00:00 via INTRAVENOUS

## 2014-04-30 MED ORDER — IOHEXOL 300 MG/ML  SOLN
25.0000 mL | Freq: Once | INTRAMUSCULAR | Status: AC | PRN
  Administered 2014-04-30: 25 mL via ORAL

## 2014-04-30 MED ORDER — ONDANSETRON 4 MG PO TBDP
8.0000 mg | ORAL_TABLET | Freq: Once | ORAL | Status: AC
  Administered 2014-04-30: 8 mg via ORAL
  Filled 2014-04-30: qty 2

## 2014-04-30 MED ORDER — DOCUSATE SODIUM 100 MG PO CAPS
100.0000 mg | ORAL_CAPSULE | Freq: Two times a day (BID) | ORAL | Status: DC | PRN
  Administered 2014-04-30: 100 mg via ORAL
  Filled 2014-04-30: qty 1

## 2014-04-30 MED ORDER — OXYCODONE-ACETAMINOPHEN 5-325 MG PO TABS
1.0000 | ORAL_TABLET | Freq: Once | ORAL | Status: AC
  Administered 2014-04-30: 1 via ORAL
  Filled 2014-04-30 (×2): qty 1

## 2014-04-30 MED ORDER — HYDROMORPHONE HCL PF 1 MG/ML IJ SOLN
1.0000 mg | Freq: Once | INTRAMUSCULAR | Status: AC
  Administered 2014-04-30: 1 mg via INTRAVENOUS
  Filled 2014-04-30: qty 1

## 2014-04-30 MED ORDER — KCL-LACTATED RINGERS-D5W 20 MEQ/L IV SOLN
INTRAVENOUS | Status: DC
  Administered 2014-04-30 – 2014-05-04 (×10): via INTRAVENOUS
  Filled 2014-04-30 (×15): qty 1000

## 2014-04-30 NOTE — ED Notes (Signed)
[facility]

## 2014-04-30 NOTE — ED Notes (Signed)
Patient finished contrast, called CT.

## 2014-04-30 NOTE — ED Notes (Signed)
Called report to 3rd floor at [facility].

## 2014-04-30 NOTE — Progress Notes (Signed)
Ur chart review completed.  

## 2014-04-30 NOTE — ED Notes (Signed)
Patient was unable to void after attempt.

## 2014-04-30 NOTE — ED Notes (Signed)
Reported to Dr. Roxanne Mins that Radiologist called and left contact info.  Dr. Roxanne Mins acknowledges, and has already reviewed the CT scan results.  No new orders received at this time.

## 2014-04-30 NOTE — H&P (Signed)
Kathryn Lara is an 38 y.o. female presenting to [facility] ER overnight secondary to abdominal pain.  She reports feeling warm yesterday but woke up with severe abdominal pain.  She is POD#13 s/p D&E for missed ab by Dr. Kerin Perna.  Op note reveals no complications.  Patient was noted to have significant fecal impaction for which she received an enema.  She reports bleeding on POD#3 and increased pain for which she was treated with po pain meds.  She has continued to have constipation which is not unusual for her.  She denies fever or chills.  She has nausea but no vomiting.  She has no urinary symptoms.  Her pain does not respond to percocet.  Her last ovulation induction has 2 months ago, per pt.    Pertinent Gynecological History: Menses: n/a Bleeding: none Contraception: none DES exposure: unknown Blood transfusions: none Sexually transmitted diseases: no past history Previous GYN Procedures: DNC  Last mammogram: n/a Date: n/a Last pap: n/a Date: n/a OB History: G2, P1    Menstrual History: Menarche age: n/a  Patient's last menstrual period was 02/22/2014.    Past Medical History  Diagnosis Date  . Missed abortion   . Wears glasses   . Infertility, female     Past Surgical History  Procedure Laterality Date  . Wisdom tooth extraction    . Ovarian egg retrieval  03-09-2014  . Dilation and curettage of uterus N/A 04/17/2014    Procedure: SUCTION DILATATION AND CURETTAGE;  Surgeon: Governor Specking, MD;  Location: [facility];  Service: Gynecology;  Laterality: N/A;    Family History  Problem Relation Age of Onset  . Heart disease Neg Hx   . Stroke Neg Hx     Social History:  reports that she quit smoking about 3 months ago. Her smoking use included Cigarettes. She smoked 0.00 packs per day. She has never used smokeless tobacco. She reports that she does not drink alcohol or use illicit drugs.  Allergies:  Allergies  Allergen Reactions  . Sulfa Antibiotics  Hives    Prescriptions prior to admission  Medication Sig Dispense Refill  . ibuprofen (ADVIL,MOTRIN) 200 MG tablet Take 800 mg by mouth every 6 (six) hours as needed for moderate pain.        Review of Systems  Gastrointestinal: Positive for nausea, abdominal pain, constipation and blood in stool. Negative for vomiting.  Genitourinary: Negative for dysuria and urgency.    Blood pressure 102/69, pulse 83, temperature 97.7 F (36.5 C), temperature source Oral, resp. rate 18, height 5\' 4"  (1.626 m), weight 158 lb (71.668 kg), last menstrual period 02/22/2014, SpO2 98.00%. Physical Exam  Constitutional: She is oriented to person, place, and time. She appears well-developed and well-nourished.  GI: Soft. She exhibits no distension. There is tenderness. There is guarding. There is no rebound.  Neurological: She is alert and oriented to person, place, and time.  Skin: Skin is warm and dry.  Psychiatric: She has a normal mood and affect. Her behavior is normal.    Results for orders placed during the hospital encounter of 04/30/14 (from the past 24 hour(s))  CBC WITH DIFFERENTIAL     Status: Abnormal   Collection Time    04/30/14  2:33 AM      Result Value Ref Range   WBC 23.1 (*) 4.0 - 10.5 K/uL   RBC 3.78 (*) 3.87 - 5.11 MIL/uL   Hemoglobin 11.1 (*) 12.0 - 15.0 g/dL   HCT 33.4 (*) 36.0 -  46.0 %   MCV 88.4  78.0 - 100.0 fL   MCH 29.4  26.0 - 34.0 pg   MCHC 33.2  30.0 - 36.0 g/dL   RDW 12.6  11.5 - 15.5 %   Platelets 363  150 - 400 K/uL   Neutrophils Relative % 90 (*) 43 - 77 %   Lymphocytes Relative 6 (*) 12 - 46 %   Monocytes Relative 4  3 - 12 %   Eosinophils Relative 0  0 - 5 %   Basophils Relative 0  0 - 1 %   Neutro Abs 20.8 (*) 1.7 - 7.7 K/uL   Lymphs Abs 1.4  0.7 - 4.0 K/uL   Monocytes Absolute 0.9  0.1 - 1.0 K/uL   Eosinophils Absolute 0.0  0.0 - 0.7 K/uL   Basophils Absolute 0.0  0.0 - 0.1 K/uL   WBC Morphology MILD LEFT SHIFT (1-5% METAS, OCC MYELO, OCC BANDS)     COMPREHENSIVE METABOLIC PANEL     Status: Abnormal   Collection Time    04/30/14  2:33 AM      Result Value Ref Range   Sodium 137  137 - 147 mEq/L   Potassium 3.6 (*) 3.7 - 5.3 mEq/L   Chloride 95 (*) 96 - 112 mEq/L   CO2 24  19 - 32 mEq/L   Glucose, Bld 139 (*) 70 - 99 mg/dL   BUN 7  6 - 23 mg/dL   Creatinine, Ser 0.94  0.50 - 1.10 mg/dL   Calcium 9.1  8.4 - 10.5 mg/dL   Total Protein 7.7  6.0 - 8.3 g/dL   Albumin 3.2 (*) 3.5 - 5.2 g/dL   AST 12  0 - 37 U/L   ALT 10  0 - 35 U/L   Alkaline Phosphatase 89  39 - 117 U/L   Total Bilirubin 0.7  0.3 - 1.2 mg/dL   GFR calc non Af Amer 76 (*) >90 mL/min   GFR calc Af Amer 88 (*) >90 mL/min   Anion gap 18 (*) 5 - 15  LIPASE, BLOOD     Status: Abnormal   Collection Time    04/30/14  2:33 AM      Result Value Ref Range   Lipase 8 (*) 11 - 59 U/L  HCG, QUANTITATIVE, PREGNANCY     Status: Abnormal   Collection Time    04/30/14  3:29 AM      Result Value Ref Range   hCG, Beta Chain, Quant, S 33 (*) <5 mIU/mL  I-STAT CG4 LACTIC ACID, ED     Status: None   Collection Time    04/30/14  4:33 AM      Result Value Ref Range   Lactic Acid, Venous 1.40  0.5 - 2.2 mmol/L  URINALYSIS, ROUTINE W REFLEX MICROSCOPIC     Status: Abnormal   Collection Time    04/30/14  5:35 AM      Result Value Ref Range   Color, Urine YELLOW  YELLOW   APPearance CLEAR  CLEAR   Specific Gravity, Urine <1.005 (*) 1.005 - 1.030   pH 5.0  5.0 - 8.0   Glucose, UA NEGATIVE  NEGATIVE mg/dL   Hgb urine dipstick SMALL (*) NEGATIVE   Bilirubin Urine NEGATIVE  NEGATIVE   Ketones, ur 40 (*) NEGATIVE mg/dL   Protein, ur NEGATIVE  NEGATIVE mg/dL   Urobilinogen, UA 1.0  0.0 - 1.0 mg/dL   Nitrite NEGATIVE  NEGATIVE   Leukocytes, UA NEGATIVE  NEGATIVE  URINE MICROSCOPIC-ADD ON     Status: Abnormal   Collection Time    04/30/14  5:35 AM      Result Value Ref Range   Squamous Epithelial / LPF RARE  RARE   WBC, UA 0-2  <3 WBC/hpf   RBC / HPF 0-2  <3 RBC/hpf    Bacteria, UA FEW (*) RARE    Ct Abdomen Pelvis W Contrast  04/30/2014   CLINICAL DATA:  Severe lower abdominal pain. Status post D and C 2 weeks ago.  EXAM: CT ABDOMEN AND PELVIS WITH CONTRAST  TECHNIQUE: Multidetector CT imaging of the abdomen and pelvis was performed using the standard protocol following bolus administration of intravenous contrast.  CONTRAST:  184mL OMNIPAQUE IOHEXOL 300 MG/ML  SOLN  COMPARISON:  None.  FINDINGS: LUNG BASES: Included view of the lung bases are clear. Visualized heart and pericardium are unremarkable.  SOLID ORGANS: The liver, spleen, gallbladder, pancreas and adrenal glands are unremarkable.  GASTROINTESTINAL TRACT: The stomach, small and large bowel are normal in course and caliber without inflammatory changes. Normal appendix.  KIDNEYS/ URINARY TRACT: Kidneys are orthotopic, demonstrating symmetric enhancement. No nephrolithiasis, hydronephrosis or renal masses. The unopacified ureters are normal in course and caliber. Delayed imaging through the kidneys demonstrates symmetric prompt excretion to the proximal urinary collecting system. Urinary bladder is partially distended and unremarkable.  PERITONEUM/RETROPERITONEUM: Small amount of diffuse ascites with thickened appearance of the omentum, no intraperitoneal free air. Complex multi-cystic right adnexal 6.5 x 5.4 cm mass with dominant 3 x 2.2 cm cystic component. Irregular surrounding enhancement. Left ovary is unremarkable. Uterus is unremarkable by CT.  SOFT TISSUE/OSSEOUS STRUCTURES: Nonsuspicious.  IMPRESSION: Enlarged 6.5 x 5.4 cm right adnexal with multiple cysts. In addition, small amount of diffuse ascites with inflamed omentum. Constellation of findings could reflect tubo-ovarian abscess, less likely ovarian hyperstimulation syndrome due to unilaterality. Recommend clinical correlation and follow-up ultrasound as clinically indicated.   Electronically Signed   By: Elon Alas   On: 04/30/2014 05:52     Assessment/Plan: 49YD with pelvic abscess -IV Pip-tazo -IVF -NPO for now -Pelvic u/s -Daily labs  Kanton Kamel 04/30/2014, 10:20 AM

## 2014-04-30 NOTE — Progress Notes (Signed)
Reports minimal improvement in pain.  Pain meds are helping but wearing off too quickly.  No nausea.  Requesting dinner.    VSS.  AF.   Gen: A&O x 3 Abd: soft, nondistended, improved tenderness to palpation compared to this am.   Ext: no c/c/e  Dr. Owens Shark (radiologist) called and reported TOA on u/s measuring 7.5 cm.    37yo with TOA -Continue Unasyn -Daily CBC -Monitor for clinical improvement.  If no improvement in 48-72 hours, laparotomy vs ?IR drainage. -Regular diet -Patient counseled re: risk of abscess rupture in which case we would proceed emergently to [place], DO

## 2014-04-30 NOTE — ED Notes (Signed)
[facility] transport here, report given to [employer], EMT-P.

## 2014-04-30 NOTE — ED Notes (Signed)
Patient reports she had a d&c two weeks ago, and she has stomach pain "cramping" that started a few hours ago.  Pain level 10/10.  She took motrin 800mg  about an hour ago for pain management with no relied.

## 2014-04-30 NOTE — ED Provider Notes (Signed)
CSN: [id number]     Arrival date & time 04/30/14  0152 History   First MD Initiated Contact with Patient 04/30/14 0314     Chief Complaint  Patient presents with  . Abdominal Pain     (Consider location/radiation/quality/duration/timing/severity/associated sxs/prior Treatment) Patient is a 38 y.o. female presenting with abdominal pain. The history is provided by the patient.  Abdominal Pain She is complaining of severe pain across her lower abdomen this evening. There was mild pain this afternoon which got significantly worse this evening. Pain is sharp and severe and she rated it at 10/10. She had received a dose of oxycodone-acetaminophen while waiting for a room to become available and that has decreased pain level to 8/10. With the decrease in pain level, she is aware of pain waxing and waning. There is associated nausea without vomiting. She has had chills and sweats. She denies dysuria or constipation or diarrhea. She is 2 weeks status post dilatation and curettage for missed abortion. She states that recovery had been uneventful except that it was several days before she started having bleeding. Pain is noted to be worse with movement and she did notice that it got worse at the car went over a bump while coming to the hospital.  Past Medical History  Diagnosis Date  . Missed abortion   . Wears glasses   . Infertility, female    Past Surgical History  Procedure Laterality Date  . Wisdom tooth extraction    . Ovarian egg retrieval  03-09-2014  . Dilation and curettage of uterus N/A 04/17/2014    Procedure: SUCTION DILATATION AND CURETTAGE;  Surgeon: Governor Specking, MD;  Location: [place];  Service: Gynecology;  Laterality: N/A;   Family History  Problem Relation Age of Onset  . Heart disease Neg Hx   . Stroke Neg Hx    History  Substance Use Topics  . Smoking status: Former Smoker    Types: Cigarettes    Quit date: 01/14/2014  . Smokeless tobacco: Never  Used     Comment: WAS A SOCIAL SMOKER  . Alcohol Use: No     Comment: OCCASIONAL   OB History   Grav Para Term Preterm Abortions TAB SAB Ect Mult Living                 Review of Systems  Gastrointestinal: Positive for abdominal pain.  All other systems reviewed and are negative.     Allergies  Sulfa antibiotics  Home Medications   Prior to Admission medications   Medication Sig Start Date End Date Taking? Authorizing Provider  ibuprofen (ADVIL,MOTRIN) 200 MG tablet Take 800 mg by mouth every 6 (six) hours as needed for moderate pain.   Yes Historical Provider, MD   BP 121/90  Pulse 81  Temp(Src) 97.5 F (36.4 C) (Oral)  Resp 20  Ht 5\' 4"  (1.626 m)  Wt 158 lb (71.668 kg)  BMI 27.11 kg/m2  SpO2 100%  LMP 02/22/2014 Physical Exam  Nursing note and vitals reviewed.  38 year old female, who appears uncomfortable, but is in no acute distress. Vital signs are normal. Oxygen saturation is 100%, which is normal. Head is normocephalic and atraumatic. PERRLA, EOMI. Oropharynx is clear. Neck is nontender and supple without adenopathy or JVD. Back is nontender and there is no CVA tenderness. Lungs are clear without rales, wheezes, or rhonchi. Chest is nontender. Heart has regular rate and rhythm without murmur. Abdomen is soft, flat, with marked tenderness across the lower  abdomen. Mild rebound tenderness is present but there is no guarding. There are no masses or hepatosplenomegaly and peristalsis is hypoactive. Extremities have no cyanosis or edema, full range of motion is present. Skin is warm and dry without rash. Neurologic: Mental status is normal, cranial nerves are intact, there are no motor or sensory deficits.  ED Course  Procedures (including critical care time) Labs Review Results for orders placed during the hospital encounter of 04/30/14  CBC WITH DIFFERENTIAL      Result Value Ref Range   WBC 23.1 (*) 4.0 - 10.5 K/uL   RBC 3.78 (*) 3.87 - 5.11 MIL/uL    Hemoglobin 11.1 (*) 12.0 - 15.0 g/dL   HCT 33.4 (*) 36.0 - 46.0 %   MCV 88.4  78.0 - 100.0 fL   MCH 29.4  26.0 - 34.0 pg   MCHC 33.2  30.0 - 36.0 g/dL   RDW 12.6  11.5 - 15.5 %   Platelets 363  150 - 400 K/uL   Neutrophils Relative % 90 (*) 43 - 77 %   Lymphocytes Relative 6 (*) 12 - 46 %   Monocytes Relative 4  3 - 12 %   Eosinophils Relative 0  0 - 5 %   Basophils Relative 0  0 - 1 %   Neutro Abs 20.8 (*) 1.7 - 7.7 K/uL   Lymphs Abs 1.4  0.7 - 4.0 K/uL   Monocytes Absolute 0.9  0.1 - 1.0 K/uL   Eosinophils Absolute 0.0  0.0 - 0.7 K/uL   Basophils Absolute 0.0  0.0 - 0.1 K/uL   WBC Morphology MILD LEFT SHIFT (1-5% METAS, OCC MYELO, OCC BANDS)    COMPREHENSIVE METABOLIC PANEL      Result Value Ref Range   Sodium 137  137 - 147 mEq/L   Potassium 3.6 (*) 3.7 - 5.3 mEq/L   Chloride 95 (*) 96 - 112 mEq/L   CO2 24  19 - 32 mEq/L   Glucose, Bld 139 (*) 70 - 99 mg/dL   BUN 7  6 - 23 mg/dL   Creatinine, Ser 0.94  0.50 - 1.10 mg/dL   Calcium 9.1  8.4 - 10.5 mg/dL   Total Protein 7.7  6.0 - 8.3 g/dL   Albumin 3.2 (*) 3.5 - 5.2 g/dL   AST 12  0 - 37 U/L   ALT 10  0 - 35 U/L   Alkaline Phosphatase 89  39 - 117 U/L   Total Bilirubin 0.7  0.3 - 1.2 mg/dL   GFR calc non Af Amer 76 (*) >90 mL/min   GFR calc Af Amer 88 (*) >90 mL/min   Anion gap 18 (*) 5 - 15  LIPASE, BLOOD      Result Value Ref Range   Lipase 8 (*) 11 - 59 U/L  HCG, QUANTITATIVE, PREGNANCY      Result Value Ref Range   hCG, Beta Chain, Quant, S 33 (*) <5 mIU/mL  I-STAT CG4 LACTIC ACID, ED      Result Value Ref Range   Lactic Acid, Venous 1.40  0.5 - 2.2 mmol/L   Imaging Review Ct Abdomen Pelvis W Contrast  04/30/2014   CLINICAL DATA:  Severe lower abdominal pain. Status post D and C 2 weeks ago.  EXAM: CT ABDOMEN AND PELVIS WITH CONTRAST  TECHNIQUE: Multidetector CT imaging of the abdomen and pelvis was performed using the standard protocol following bolus administration of intravenous contrast.  CONTRAST:  163mL  OMNIPAQUE IOHEXOL 300 MG/ML  SOLN  COMPARISON:  None.  FINDINGS: LUNG BASES: Included view of the lung bases are clear. Visualized heart and pericardium are unremarkable.  SOLID ORGANS: The liver, spleen, gallbladder, pancreas and adrenal glands are unremarkable.  GASTROINTESTINAL TRACT: The stomach, small and large bowel are normal in course and caliber without inflammatory changes. Normal appendix.  KIDNEYS/ URINARY TRACT: Kidneys are orthotopic, demonstrating symmetric enhancement. No nephrolithiasis, hydronephrosis or renal masses. The unopacified ureters are normal in course and caliber. Delayed imaging through the kidneys demonstrates symmetric prompt excretion to the proximal urinary collecting system. Urinary bladder is partially distended and unremarkable.  PERITONEUM/RETROPERITONEUM: Small amount of diffuse ascites with thickened appearance of the omentum, no intraperitoneal free air. Complex multi-cystic right adnexal 6.5 x 5.4 cm mass with dominant 3 x 2.2 cm cystic component. Irregular surrounding enhancement. Left ovary is unremarkable. Uterus is unremarkable by CT.  SOFT TISSUE/OSSEOUS STRUCTURES: Nonsuspicious.  IMPRESSION: Enlarged 6.5 x 5.4 cm right adnexal with multiple cysts. In addition, small amount of diffuse ascites with inflamed omentum. Constellation of findings could reflect tubo-ovarian abscess, less likely ovarian hyperstimulation syndrome due to unilaterality. Recommend clinical correlation and follow-up ultrasound as clinically indicated.   Electronically Signed   By: Elon Alas   On: 04/30/2014 05:52   MDM   Final diagnoses:  Lower abdominal pain  Leukocytosis  Adnexal mass    Lower abdominal pain 2 weeks post dilatation and curettage. Differential diagnosis is broad but she need to consider infectious complication of D&C as well as possible perforation. Also need to consider appendicitis and other non-gynecologic conditions. Old records are reviewed and dilatation  and curettage was done on June 30 and appeared to be an uncomplicated procedure. WBC is come back markedly elevated at 23.1 with a left shift. She will be sent for CT scan.  CT shows complex right adnexal mass with cyst of possibly consistent with tubo-ovarian abscess. She is started on Anaprox of ceftriaxone. Case is been discussed with Dr. Julien Girt who is on call for her general gynecologist Dr. Lynnette Caffey. Dr. Orvan Seen except the patient in transfer to the [facility] [place].  Delora Fuel, MD [id number]

## 2014-04-30 NOTE — ED Notes (Signed)
Patient transported to CT 

## 2014-04-30 NOTE — ED Notes (Signed)
Rise Paganini, Network engineer is calling for transport.

## 2014-04-30 NOTE — ED Notes (Signed)
Courtney, Radiologist, called to see if patient was taking in fertilization medications.  Patient reports she took Lucron and Minapere around the end of May, and Progestrone shots June 15th.  Loma Sousa acknowledges, and leaves contact number for Dr. Roxanne Mins. [phone].

## 2014-05-01 LAB — CBC WITH DIFFERENTIAL/PLATELET
BASOS ABS: 0 10*3/uL (ref 0.0–0.1)
Basophils Relative: 0 % (ref 0–1)
EOS ABS: 0 10*3/uL (ref 0.0–0.7)
EOS PCT: 0 % (ref 0–5)
HCT: 29.4 % — ABNORMAL LOW (ref 36.0–46.0)
Hemoglobin: 9.8 g/dL — ABNORMAL LOW (ref 12.0–15.0)
LYMPHS PCT: 4 % — AB (ref 12–46)
Lymphs Abs: 1 10*3/uL (ref 0.7–4.0)
MCH: 29.3 pg (ref 26.0–34.0)
MCHC: 33.3 g/dL (ref 30.0–36.0)
MCV: 87.8 fL (ref 78.0–100.0)
Monocytes Absolute: 0.8 10*3/uL (ref 0.1–1.0)
Monocytes Relative: 3 % (ref 3–12)
Neutro Abs: 23.7 10*3/uL — ABNORMAL HIGH (ref 1.7–7.7)
Neutrophils Relative %: 93 % — ABNORMAL HIGH (ref 43–77)
Platelets: 300 10*3/uL (ref 150–400)
RBC: 3.35 MIL/uL — ABNORMAL LOW (ref 3.87–5.11)
RDW: 12.9 % (ref 11.5–15.5)
WBC: 25.5 10*3/uL — AB (ref 4.0–10.5)

## 2014-05-01 MED ORDER — GI COCKTAIL ~~LOC~~
30.0000 mL | Freq: Once | ORAL | Status: AC
  Administered 2014-05-01: 30 mL via ORAL
  Filled 2014-05-01: qty 30

## 2014-05-01 MED ORDER — METRONIDAZOLE IN NACL 5-0.79 MG/ML-% IV SOLN
500.0000 mg | Freq: Three times a day (TID) | INTRAVENOUS | Status: DC
  Administered 2014-05-01 – 2014-05-04 (×9): 500 mg via INTRAVENOUS
  Filled 2014-05-01 (×11): qty 100

## 2014-05-01 MED ORDER — ACETAMINOPHEN 500 MG PO TABS
1000.0000 mg | ORAL_TABLET | Freq: Once | ORAL | Status: AC
  Administered 2014-05-01: 1000 mg via ORAL
  Filled 2014-05-01: qty 2

## 2014-05-01 NOTE — Progress Notes (Signed)
This am, Patient reports feeling a little better. Pain was 10/10 now 5/10. She is ordering breakfast. Denies any nausea.  Currently afebrile tmax is going down Abdomen is non distended but still very tender LLQ and RLQ and no rebound  WBC is 25  IMPRESSION: POST OP D and E on June 30 Tubo ovarian abscess  PLAN: I do think she is improved slightly  Continue IV Unasyn Repeat cbc in am I did call Dr. Charlett Lango office this am - he is away on vacation this week. His nurse will inform Dr. Kerin Perna of patient's admission.

## 2014-05-02 LAB — CBC WITH DIFFERENTIAL/PLATELET
BASOS ABS: 0 10*3/uL (ref 0.0–0.1)
BASOS PCT: 0 % (ref 0–1)
Eosinophils Absolute: 0 10*3/uL (ref 0.0–0.7)
Eosinophils Relative: 0 % (ref 0–5)
HCT: 27.4 % — ABNORMAL LOW (ref 36.0–46.0)
Hemoglobin: 9.3 g/dL — ABNORMAL LOW (ref 12.0–15.0)
LYMPHS PCT: 5 % — AB (ref 12–46)
Lymphs Abs: 1.2 10*3/uL (ref 0.7–4.0)
MCH: 29.3 pg (ref 26.0–34.0)
MCHC: 33.9 g/dL (ref 30.0–36.0)
MCV: 86.4 fL (ref 78.0–100.0)
Monocytes Absolute: 0.8 10*3/uL (ref 0.1–1.0)
Monocytes Relative: 3 % (ref 3–12)
NEUTROS ABS: 24.1 10*3/uL — AB (ref 1.7–7.7)
Neutrophils Relative %: 92 % — ABNORMAL HIGH (ref 43–77)
PLATELETS: 301 10*3/uL (ref 150–400)
RBC: 3.17 MIL/uL — ABNORMAL LOW (ref 3.87–5.11)
RDW: 12.9 % (ref 11.5–15.5)
WBC: 26.1 10*3/uL — ABNORMAL HIGH (ref 4.0–10.5)

## 2014-05-02 LAB — APTT: aPTT: 33 seconds (ref 24–37)

## 2014-05-02 LAB — PROTIME-INR
INR: 1.41 (ref 0.00–1.49)
Prothrombin Time: 17.3 seconds — ABNORMAL HIGH (ref 11.6–15.2)

## 2014-05-02 MED ORDER — FLUCONAZOLE 150 MG PO TABS
150.0000 mg | ORAL_TABLET | Freq: Once | ORAL | Status: AC
  Administered 2014-05-03: 150 mg via ORAL
  Filled 2014-05-02: qty 1

## 2014-05-02 MED ORDER — ACETAMINOPHEN 500 MG PO TABS
1000.0000 mg | ORAL_TABLET | Freq: Once | ORAL | Status: AC
  Administered 2014-05-02: 1000 mg via ORAL
  Filled 2014-05-02: qty 2

## 2014-05-02 NOTE — Consult Note (Signed)
Reason for Consult:pelvic abscess Referring Physician: [name] is an 38 y.o. female.  HPI: Events noted. Asked to assess for percutaneous drain placement. CT and [place] reviewed.  Past Medical History  Diagnosis Date  . Missed abortion   . Wears glasses   . Infertility, female     Past Surgical History  Procedure Laterality Date  . Wisdom tooth extraction    . Ovarian egg retrieval  03-09-2014  . Dilation and curettage of uterus N/A 04/17/2014    Procedure: SUCTION DILATATION AND CURETTAGE;  Surgeon: Kathryn Specking, MD;  Location: [place];  Service: Gynecology;  Laterality: N/A;    Family History  Problem Relation Age of Onset  . Heart disease Neg Hx   . Stroke Neg Hx     Social History:  reports that she quit smoking about 3 months ago. Her smoking use included Cigarettes. She smoked 0.00 packs per day. She has never used smokeless tobacco. She reports that she does not drink alcohol or use illicit drugs.  Allergies:  Allergies  Allergen Reactions  . Sulfa Antibiotics Hives    Medications:  Scheduled: . fluconazole  150 mg Oral Once  . metronidazole  500 mg Intravenous 3 times per day  . piperacillin-tazobactam (ZOSYN)  IV  3.375 g Intravenous Q8H  . prenatal multivitamin  1 tablet Oral Q1200    Results for orders placed during the hospital encounter of 04/30/14 (from the past 48 hour(s))  CBC WITH DIFFERENTIAL     Status: Abnormal   Collection Time    05/01/14  5:00 AM      Result Value Ref Range   WBC 25.5 (*) 4.0 - 10.5 K/uL   RBC 3.35 (*) 3.87 - 5.11 MIL/uL   Hemoglobin 9.8 (*) 12.0 - 15.0 g/dL   HCT 29.4 (*) 36.0 - 46.0 %   MCV 87.8  78.0 - 100.0 fL   MCH 29.3  26.0 - 34.0 pg   MCHC 33.3  30.0 - 36.0 g/dL   RDW 12.9  11.5 - 15.5 %   Platelets 300  150 - 400 K/uL   Neutrophils Relative % 93 (*) 43 - 77 %   Neutro Abs 23.7 (*) 1.7 - 7.7 K/uL   Lymphocytes Relative 4 (*) 12 - 46 %   Lymphs Abs 1.0  0.7 - 4.0 K/uL   Monocytes Relative 3  3 - 12 %   Monocytes Absolute 0.8  0.1 - 1.0 K/uL   Eosinophils Relative 0  0 - 5 %   Eosinophils Absolute 0.0  0.0 - 0.7 K/uL   Basophils Relative 0  0 - 1 %   Basophils Absolute 0.0  0.0 - 0.1 K/uL  CBC WITH DIFFERENTIAL     Status: Abnormal   Collection Time    05/02/14  5:25 AM      Result Value Ref Range   WBC 26.1 (*) 4.0 - 10.5 K/uL   RBC 3.17 (*) 3.87 - 5.11 MIL/uL   Hemoglobin 9.3 (*) 12.0 - 15.0 g/dL   HCT 27.4 (*) 36.0 - 46.0 %   MCV 86.4  78.0 - 100.0 fL   MCH 29.3  26.0 - 34.0 pg   MCHC 33.9  30.0 - 36.0 g/dL   RDW 12.9  11.5 - 15.5 %   Platelets 301  150 - 400 K/uL   Neutrophils Relative % 92 (*) 43 - 77 %   Neutro Abs 24.1 (*) 1.7 - 7.7 K/uL   Lymphocytes Relative  5 (*) 12 - 46 %   Lymphs Abs 1.2  0.7 - 4.0 K/uL   Monocytes Relative 3  3 - 12 %   Monocytes Absolute 0.8  0.1 - 1.0 K/uL   Eosinophils Relative 0  0 - 5 %   Eosinophils Absolute 0.0  0.0 - 0.7 K/uL   Basophils Relative 0  0 - 1 %   Basophils Absolute 0.0  0.0 - 0.1 K/uL  APTT     Status: None   Collection Time    05/02/14  9:15 AM      Result Value Ref Range   aPTT 33  24 - 37 seconds  PROTIME-INR     Status: Abnormal   Collection Time    05/02/14  9:15 AM      Result Value Ref Range   Prothrombin Time 17.3 (*) 11.6 - 15.2 seconds   INR 1.41  0.00 - 1.49    US Pelvis Complete  04/30/2014   ADDENDUM REPORT: 04/30/2014 16:06  ADDENDUM: The findings were discussed with Kathryn Lara on 04/30/2014 at 4:06 PM.   Electronically Signed   By: Kathryn Lara M.D.   On: 04/30/2014 16:06   04/30/2014   CLINICAL DATA:  Abdominal pain. Right adnexal mass seen on CT exam. By previous history the patient is status post D & C 2 weeks ago.  EXAM: TRANSABDOMINAL AND TRANSVAGINAL ULTRASOUND OF PELVIS  TECHNIQUE: Both transabdominal and transvaginal ultrasound examinations of the pelvis were performed. Transabdominal technique was performed for global imaging of the pelvis including uterus, ovaries,  adnexal regions, and pelvic cul-de-sac. It was necessary to proceed with endovaginal exam following the transabdominal exam to visualize the adnexal regions.  COMPARISON:  CT of the abdomen and pelvis on 04/30/2014  FINDINGS: Uterus  Measurements: 8.5 x 3.9 x 5.5 cm. No fibroids or other mass visualized.  Endometrium  Thickness: 1.9 mm.  No focal abnormality visualized.  Right ovary  Within the right adnexa there is a complex mass measuring 7.3 x 7.7 x 5.7 cm. Within this solid mass there are hypoechoic portions. Along the inferior aspect of the mass, possible ovarian tissue is 5.7 x 5.4 x 4.9 cm. Along the more superior portion of the mass, components have a tubular appearance. Blood flow is identified within the mass on color Doppler evaluation. The appearance of the mass and presence of blood flow would make ovarian torsion less likely.  Left ovary  Measurements: 3.9 x 2.7 x 3.3 cm. Normal appearance/no adnexal mass.  Other findings  A small amount of free pelvic fluid is identified.  IMPRESSION: 1. Complex solid right adnexal mass, favored to represent a combination of ovarian and tubal elements. Considerations include tubo-ovarian abscess, ectopic pregnancy, ovarian, or tubal mass. Malignancy cannot be excluded but is considered less likely. 2. Given the solid appearance of the mass, followup is needed. Depending on the clinical scenario, correlation with serial quantitative beta HCG, short-term ultrasound follow-up, pelvic MRI with contrast, or direct visualization may be appropriate. 3. No evidence for retained products of conception. 4. Normal appearance of the left ovary.  Electronically Signed: By: Kathryn Lara M.D. On: 04/30/2014 15:07    ROS Blood pressure 100/59, pulse 93, temperature 98.2 F (36.8 C), temperature source Oral, resp. rate 18, height 5\' 4"  (1.626 m), weight 158 lb (71.668 kg), last menstrual period 02/22/2014, SpO2 99.00%. Physical Exam  Assessment/Plan: CT and US show no dominant  drainable abscess collection at this time. Consider empiric abx coverage, f/u CT  Pelvis with contrast to reassess  In 3+ days if inadequate symptomatic response.   Kathryn Lara III,Kathryn Lara Kathryn Lara 05/02/2014, 10:21 AM

## 2014-05-02 NOTE — Progress Notes (Signed)
S/  Feeling some better O// BP 107/62  Pulse 119  Temp(Src) 99.9 F (37.7 C) (Oral)  Resp 18  Ht 5\' 4"  (1.626 m)  Wt 158 lb (71.668 kg)  BMI 27.11 kg/m2  SpO2 97%  LMP 02/22/2014  abd soft, + BS, tender LQ w/o reb CBC    Component Value Date/Time   WBC 26.1* 05/02/2014 0525   RBC 3.17* 05/02/2014 0525   HGB 9.3* 05/02/2014 0525   HCT 27.4* 05/02/2014 0525   PLT 301 05/02/2014 0525   MCV 86.4 05/02/2014 0525   MCH 29.3 05/02/2014 0525   MCHC 33.9 05/02/2014 0525   RDW 12.9 05/02/2014 0525   LYMPHSABS 1.2 05/02/2014 0525   MONOABS 0.8 05/02/2014 0525   EOSABS 0.0 05/02/2014 0525   BASOSABS 0.0 05/02/2014 0525    A+P..// on Pipercillin.Flagyl, will consult IR re repeat CT, see if candidate for percut I/D

## 2014-05-03 ENCOUNTER — Inpatient Hospital Stay (HOSPITAL_COMMUNITY): Payer: BC Managed Care – PPO

## 2014-05-03 LAB — CBC WITH DIFFERENTIAL/PLATELET
Basophils Absolute: 0 10*3/uL (ref 0.0–0.1)
Basophils Relative: 0 % (ref 0–1)
EOS ABS: 0.1 10*3/uL (ref 0.0–0.7)
Eosinophils Relative: 1 % (ref 0–5)
HCT: 27 % — ABNORMAL LOW (ref 36.0–46.0)
Hemoglobin: 9.3 g/dL — ABNORMAL LOW (ref 12.0–15.0)
Lymphocytes Relative: 8 % — ABNORMAL LOW (ref 12–46)
Lymphs Abs: 1.9 10*3/uL (ref 0.7–4.0)
MCH: 29.6 pg (ref 26.0–34.0)
MCHC: 34.4 g/dL (ref 30.0–36.0)
MCV: 86 fL (ref 78.0–100.0)
MONO ABS: 1.7 10*3/uL — AB (ref 0.1–1.0)
MONOS PCT: 7 % (ref 3–12)
Neutro Abs: 21.1 10*3/uL — ABNORMAL HIGH (ref 1.7–7.7)
Neutrophils Relative %: 84 % — ABNORMAL HIGH (ref 43–77)
Platelets: 287 10*3/uL (ref 150–400)
RBC: 3.14 MIL/uL — ABNORMAL LOW (ref 3.87–5.11)
RDW: 13 % (ref 11.5–15.5)
WBC: 24.8 10*3/uL — ABNORMAL HIGH (ref 4.0–10.5)

## 2014-05-03 MED ORDER — DIPHENOXYLATE-ATROPINE 2.5-0.025 MG/5ML PO LIQD
5.0000 mL | Freq: Four times a day (QID) | ORAL | Status: DC | PRN
Start: 2014-05-03 — End: 2014-05-03

## 2014-05-03 MED ORDER — DIPHENOXYLATE-ATROPINE 2.5-0.025 MG PO TABS
1.0000 | ORAL_TABLET | Freq: Four times a day (QID) | ORAL | Status: DC | PRN
  Administered 2014-05-03: 1 via ORAL
  Filled 2014-05-03: qty 1

## 2014-05-03 MED ORDER — ACETAMINOPHEN 500 MG PO TABS
1000.0000 mg | ORAL_TABLET | Freq: Three times a day (TID) | ORAL | Status: DC | PRN
  Administered 2014-05-03: 1000 mg via ORAL
  Filled 2014-05-03: qty 2

## 2014-05-03 NOTE — Progress Notes (Signed)
Subjective: Patient reports tolerating PO.   Cotninues with moderate pain, intermittant fever, diarrhea Objective: I have reviewed patient's vital signs.  General: alert, cooperative and moderate distress GI: soft, non-tender; bowel sounds normal; no masses,  no organomegaly Tender RLQ, mild rebound   Assessment/Plan: TOA Int Rad said no CT or drainage yesterday.  But reconsider in 3 days if not improving ABX - Zosyn/Flagyl - ? Improving.  WBC slightly improving but still intermittant fever Plan [place] today, cont Abx, if not improving consider repeat CT/Int rad, or surgical options   LOS: 3 days    Kathryn Lara C 05/03/2014, 9:54 AM

## 2014-05-03 NOTE — Progress Notes (Signed)
Patient ID: Kathryn Lara, female   DOB: Dec 11, 1975, 38 y.o.   MRN: [id number] US pelvis completed IMPRESSION:  Normal appearing uterus and ovaries.  Complex RIGHT adnexal mass adjacent to RIGHT ovary, 7.8 x 5.1 x 5.3  cm in size, in combination with CT findings most consistent with a  tubo-ovarian abscess, slightly decreased in size since 04/30/2014.  Trace free pelvic fluid.  No new pelvic mass lesions identified.  Pt afebrile and discussed findings.  CBC in am.  Continue abx I discussed this with Yalcincaya this morning.  He is aware of patient and will be back in town 7/19.  If needs surgery or drainage he wants to be involved (he says he can transvaginally drain this if necessary) DL

## 2014-05-04 ENCOUNTER — Encounter (HOSPITAL_COMMUNITY): Payer: BC Managed Care – PPO | Admitting: Anesthesiology

## 2014-05-04 ENCOUNTER — Encounter (HOSPITAL_COMMUNITY): Payer: Self-pay | Admitting: Certified Registered"

## 2014-05-04 ENCOUNTER — Encounter (HOSPITAL_COMMUNITY)
Admission: EM | Disposition: A | Discharge status: Home / Self Care | Payer: Self-pay | Source: Home / Self Care | Attending: Obstetrics & Gynecology

## 2014-05-04 ENCOUNTER — Inpatient Hospital Stay (HOSPITAL_COMMUNITY): Payer: BC Managed Care – PPO | Admitting: Anesthesiology

## 2014-05-04 DIAGNOSIS — Z9889 Other specified postprocedural states: Secondary | ICD-10-CM

## 2014-05-04 DIAGNOSIS — K389 Disease of appendix, unspecified: Secondary | ICD-10-CM

## 2014-05-04 HISTORY — PX: CYSTOSCOPY: SHX5120

## 2014-05-04 HISTORY — PX: LAPAROTOMY: SHX154

## 2014-05-04 LAB — CBC WITH DIFFERENTIAL/PLATELET
BASOS PCT: 0 % (ref 0–1)
Basophils Absolute: 0 10*3/uL (ref 0.0–0.1)
EOS ABS: 0.3 10*3/uL (ref 0.0–0.7)
EOS PCT: 1 % (ref 0–5)
HCT: 28.6 % — ABNORMAL LOW (ref 36.0–46.0)
Hemoglobin: 9.8 g/dL — ABNORMAL LOW (ref 12.0–15.0)
LYMPHS ABS: 2.1 10*3/uL (ref 0.7–4.0)
Lymphocytes Relative: 8 % — ABNORMAL LOW (ref 12–46)
MCH: 29.3 pg (ref 26.0–34.0)
MCHC: 34.3 g/dL (ref 30.0–36.0)
MCV: 85.4 fL (ref 78.0–100.0)
Monocytes Absolute: 2.8 10*3/uL — ABNORMAL HIGH (ref 0.1–1.0)
Monocytes Relative: 11 % (ref 3–12)
NEUTROS PCT: 80 % — AB (ref 43–77)
Neutro Abs: 21 10*3/uL — ABNORMAL HIGH (ref 1.7–7.7)
PLATELETS: 314 10*3/uL (ref 150–400)
RBC: 3.35 MIL/uL — ABNORMAL LOW (ref 3.87–5.11)
RDW: 13.3 % (ref 11.5–15.5)
WBC: 26.2 10*3/uL — ABNORMAL HIGH (ref 4.0–10.5)

## 2014-05-04 LAB — ABO/RH: ABO/RH(D): O POS

## 2014-05-04 LAB — PREPARE RBC (CROSSMATCH)

## 2014-05-04 LAB — MRSA PCR SCREENING: MRSA by PCR: NEGATIVE

## 2014-05-04 SURGERY — LAPAROTOMY, EXPLORATORY
Anesthesia: General | Site: Bladder

## 2014-05-04 MED ORDER — SUCCINYLCHOLINE CHLORIDE 20 MG/ML IJ SOLN
INTRAMUSCULAR | Status: DC | PRN
  Administered 2014-05-04: 120 mg via INTRAVENOUS

## 2014-05-04 MED ORDER — METHYLENE BLUE 1 % INJ SOLN
INTRAMUSCULAR | Status: DC | PRN
  Administered 2014-05-04: 1 mL via INTRAVENOUS

## 2014-05-04 MED ORDER — SCOPOLAMINE 1 MG/3DAYS TD PT72
MEDICATED_PATCH | TRANSDERMAL | Status: AC
  Filled 2014-05-04: qty 1

## 2014-05-04 MED ORDER — SODIUM CHLORIDE 0.9 % IV SOLN: 2.0000 g | Freq: Four times a day (QID) | INTRAVENOUS | Status: DC

## 2014-05-04 MED ORDER — NEOSTIGMINE METHYLSULFATE 10 MG/10ML IV SOLN
INTRAVENOUS | Status: DC | PRN
  Administered 2014-05-04: 3 mg via INTRAVENOUS

## 2014-05-04 MED ORDER — LACTATED RINGERS IV SOLN
INTRAVENOUS | Status: DC
  Administered 2014-05-04 – 2014-05-06 (×5): via INTRAVENOUS

## 2014-05-04 MED ORDER — ONDANSETRON HCL 4 MG/2ML IJ SOLN
4.0000 mg | Freq: Four times a day (QID) | INTRAMUSCULAR | Status: DC | PRN
  Administered 2014-05-06: 4 mg via INTRAVENOUS
  Filled 2014-05-04: qty 2

## 2014-05-04 MED ORDER — MEPERIDINE HCL 25 MG/ML IJ SOLN: 6.2500 mg | INTRAMUSCULAR | Status: DC | PRN

## 2014-05-04 MED ORDER — FENTANYL CITRATE 0.05 MG/ML IJ SOLN
INTRAMUSCULAR | Status: DC | PRN
  Administered 2014-05-04 (×4): 50 ug via INTRAVENOUS
  Administered 2014-05-04: 150 ug via INTRAVENOUS
  Administered 2014-05-04: 50 ug via INTRAVENOUS

## 2014-05-04 MED ORDER — ONDANSETRON HCL 4 MG/2ML IJ SOLN
4.0000 mg | Freq: Four times a day (QID) | INTRAMUSCULAR | Status: DC | PRN
  Administered 2014-05-04 – 2014-05-05 (×2): 4 mg via INTRAVENOUS
  Filled 2014-05-04 (×2): qty 2

## 2014-05-04 MED ORDER — HYDROMORPHONE HCL PF 1 MG/ML IJ SOLN
1.0000 mg | INTRAMUSCULAR | Status: DC | PRN
  Administered 2014-05-04 (×2): 1 mg via INTRAVENOUS
  Filled 2014-05-04 (×2): qty 1

## 2014-05-04 MED ORDER — ACETAMINOPHEN 325 MG PO TABS
650.0000 mg | ORAL_TABLET | ORAL | Status: DC | PRN
  Administered 2014-05-08: 162.5 mg via ORAL
  Filled 2014-05-04 (×2): qty 2

## 2014-05-04 MED ORDER — PIPERACILLIN-TAZOBACTAM 3.375 G IVPB
3.3750 g | Freq: Three times a day (TID) | INTRAVENOUS | Status: DC
  Administered 2014-05-04 – 2014-05-06 (×5): 3.375 g via INTRAVENOUS
  Filled 2014-05-04 (×6): qty 50

## 2014-05-04 MED ORDER — MIDAZOLAM HCL 2 MG/2ML IJ SOLN
INTRAMUSCULAR | Status: DC | PRN
  Administered 2014-05-04: 2 mg via INTRAVENOUS

## 2014-05-04 MED ORDER — LIDOCAINE HCL (CARDIAC) 20 MG/ML IV SOLN
INTRAVENOUS | Status: AC
  Filled 2014-05-04: qty 5

## 2014-05-04 MED ORDER — HYDROMORPHONE 0.3 MG/ML IV SOLN
INTRAVENOUS | Status: DC
  Administered 2014-05-04: 20:00:00 via INTRAVENOUS
  Administered 2014-05-05: 1.79 mg via INTRAVENOUS
  Filled 2014-05-04: qty 25

## 2014-05-04 MED ORDER — ROCURONIUM BROMIDE 100 MG/10ML IV SOLN
INTRAVENOUS | Status: DC | PRN
  Administered 2014-05-04 (×2): 10 mg via INTRAVENOUS
  Administered 2014-05-04 (×2): 20 mg via INTRAVENOUS

## 2014-05-04 MED ORDER — GENTAMICIN SULFATE 40 MG/ML IJ SOLN: 1.5000 mg/kg | Freq: Three times a day (TID) | INTRAVENOUS | Status: DC

## 2014-05-04 MED ORDER — KETOROLAC TROMETHAMINE 30 MG/ML IJ SOLN
INTRAMUSCULAR | Status: DC | PRN
  Administered 2014-05-04: 30 mg via INTRAVENOUS

## 2014-05-04 MED ORDER — GLYCOPYRROLATE 0.2 MG/ML IJ SOLN
INTRAMUSCULAR | Status: AC
  Filled 2014-05-04: qty 3

## 2014-05-04 MED ORDER — SUCCINYLCHOLINE CHLORIDE 20 MG/ML IJ SOLN
INTRAMUSCULAR | Status: AC
  Filled 2014-05-04: qty 10

## 2014-05-04 MED ORDER — HYDROMORPHONE HCL PF 1 MG/ML IJ SOLN
0.2500 mg | INTRAMUSCULAR | Status: DC | PRN
  Administered 2014-05-04 (×4): 0.5 mg via INTRAVENOUS

## 2014-05-04 MED ORDER — PROPOFOL 10 MG/ML IV BOLUS
INTRAVENOUS | Status: DC | PRN
  Administered 2014-05-04: 200 mg via INTRAVENOUS

## 2014-05-04 MED ORDER — METRONIDAZOLE IN NACL 5-0.79 MG/ML-% IV SOLN
500.0000 mg | Freq: Three times a day (TID) | INTRAVENOUS | Status: DC
  Administered 2014-05-04 – 2014-05-06 (×5): 500 mg via INTRAVENOUS
  Filled 2014-05-04 (×6): qty 100

## 2014-05-04 MED ORDER — CLINDAMYCIN PHOSPHATE 900 MG/50ML IV SOLN: 900.0000 mg | Freq: Three times a day (TID) | INTRAVENOUS | Status: DC

## 2014-05-04 MED ORDER — NALOXONE HCL 0.4 MG/ML IJ SOLN
0.4000 mg | INTRAMUSCULAR | Status: DC | PRN
Start: 2014-05-04 — End: 2014-05-05

## 2014-05-04 MED ORDER — SCOPOLAMINE 1 MG/3DAYS TD PT72
MEDICATED_PATCH | TRANSDERMAL | Status: DC | PRN
  Administered 2014-05-04: 1 via TRANSDERMAL

## 2014-05-04 MED ORDER — NEOSTIGMINE METHYLSULFATE 10 MG/10ML IV SOLN
INTRAVENOUS | Status: AC
  Filled 2014-05-04: qty 1

## 2014-05-04 MED ORDER — ONDANSETRON HCL 4 MG/2ML IJ SOLN
INTRAMUSCULAR | Status: AC
  Filled 2014-05-04: qty 2

## 2014-05-04 MED ORDER — METHYLENE BLUE 1 % INJ SOLN
INTRAMUSCULAR | Status: AC
  Filled 2014-05-04: qty 1

## 2014-05-04 MED ORDER — HYDROMORPHONE HCL PF 1 MG/ML IJ SOLN
INTRAMUSCULAR | Status: AC
  Filled 2014-05-04: qty 1

## 2014-05-04 MED ORDER — METOCLOPRAMIDE HCL 5 MG/ML IJ SOLN: 10.0000 mg | Freq: Once | INTRAMUSCULAR | Status: DC | PRN

## 2014-05-04 MED ORDER — FENTANYL CITRATE 0.05 MG/ML IJ SOLN
INTRAMUSCULAR | Status: AC
  Filled 2014-05-04: qty 5

## 2014-05-04 MED ORDER — PROPOFOL 10 MG/ML IV EMUL
INTRAVENOUS | Status: AC
  Filled 2014-05-04: qty 20

## 2014-05-04 MED ORDER — LACTATED RINGERS IV SOLN
INTRAVENOUS | Status: DC | PRN
  Administered 2014-05-04 (×4): via INTRAVENOUS

## 2014-05-04 MED ORDER — LACTATED RINGERS IV SOLN
INTRAVENOUS | Status: DC | PRN
  Administered 2014-05-04: 15:00:00 via INTRAVENOUS

## 2014-05-04 MED ORDER — ONDANSETRON HCL 4 MG PO TABS
4.0000 mg | ORAL_TABLET | Freq: Four times a day (QID) | ORAL | Status: DC | PRN
  Administered 2014-05-06 (×3): 4 mg via ORAL
  Filled 2014-05-04 (×3): qty 1

## 2014-05-04 MED ORDER — MENTHOL 3 MG MT LOZG: 1.0000 | LOZENGE | OROMUCOSAL | Status: DC | PRN

## 2014-05-04 MED ORDER — DIPHENHYDRAMINE HCL 12.5 MG/5ML PO ELIX: 12.5000 mg | ORAL_SOLUTION | Freq: Four times a day (QID) | ORAL | Status: DC | PRN

## 2014-05-04 MED ORDER — HYDROMORPHONE HCL PF 1 MG/ML IJ SOLN
0.5000 mg | INTRAMUSCULAR | Status: AC | PRN
Start: 2014-05-04 — End: 2014-05-04
  Administered 2014-05-04 (×2): 0.5 mg via INTRAVENOUS

## 2014-05-04 MED ORDER — ROCURONIUM BROMIDE 100 MG/10ML IV SOLN
INTRAVENOUS | Status: AC
  Filled 2014-05-04: qty 1

## 2014-05-04 MED ORDER — GLYCOPYRROLATE 0.2 MG/ML IJ SOLN
INTRAMUSCULAR | Status: DC | PRN
  Administered 2014-05-04: 0.6 mg via INTRAVENOUS

## 2014-05-04 MED ORDER — MIDAZOLAM HCL 2 MG/2ML IJ SOLN
INTRAMUSCULAR | Status: AC
  Filled 2014-05-04: qty 2

## 2014-05-04 MED ORDER — OXYCODONE-ACETAMINOPHEN 5-325 MG PO TABS
1.0000 | ORAL_TABLET | ORAL | Status: DC | PRN
  Administered 2014-05-05: 2 via ORAL
  Administered 2014-05-05: 1 via ORAL
  Administered 2014-05-05: 2 via ORAL
  Administered 2014-05-05: 1 via ORAL
  Administered 2014-05-06 (×2): 2 via ORAL
  Administered 2014-05-06: 1 via ORAL
  Administered 2014-05-07: 2 via ORAL
  Filled 2014-05-04: qty 1
  Filled 2014-05-04: qty 2
  Filled 2014-05-04: qty 1
  Filled 2014-05-04 (×6): qty 2

## 2014-05-04 MED ORDER — ONDANSETRON HCL 4 MG/2ML IJ SOLN
INTRAMUSCULAR | Status: DC | PRN
  Administered 2014-05-04: 4 mg via INTRAVENOUS

## 2014-05-04 MED ORDER — SODIUM CHLORIDE 0.9 % IJ SOLN: 9.0000 mL | INTRAMUSCULAR | Status: DC | PRN

## 2014-05-04 MED ORDER — KETOROLAC TROMETHAMINE 30 MG/ML IJ SOLN
INTRAMUSCULAR | Status: AC
  Filled 2014-05-04: qty 1

## 2014-05-04 MED ORDER — DIPHENHYDRAMINE HCL 50 MG/ML IJ SOLN: 12.5000 mg | Freq: Four times a day (QID) | INTRAMUSCULAR | Status: DC | PRN

## 2014-05-04 MED ORDER — DEXAMETHASONE SODIUM PHOSPHATE 10 MG/ML IJ SOLN
INTRAMUSCULAR | Status: AC
  Filled 2014-05-04: qty 1

## 2014-05-04 SURGICAL SUPPLY — 36 items
CANISTER SUCT 3000ML (MISCELLANEOUS) ×4 IMPLANT
CLOTH BEACON ORANGE TIMEOUT ST (SAFETY) ×4 IMPLANT
DECANTER SPIKE VIAL GLASS SM (MISCELLANEOUS) IMPLANT
DRAPE WARM FLUID 44X44 (DRAPE) IMPLANT
DRSG OPSITE POSTOP 4X10 (GAUZE/BANDAGES/DRESSINGS) ×4 IMPLANT
ELECT BLADE 6 FLAT ULTRCLN (ELECTRODE) IMPLANT
GLOVE BIO SURGEON STRL SZ7 (GLOVE) ×4 IMPLANT
GOWN STRL REUS W/TWL LRG LVL3 (GOWN DISPOSABLE) ×8 IMPLANT
IV STOPCOCK 4 WAY 40  W/Y SET (IV SOLUTION)
IV STOPCOCK 4 WAY 40 W/Y SET (IV SOLUTION) IMPLANT
NEEDLE HYPO 25X1 1.5 SAFETY (NEEDLE) IMPLANT
NS IRRIG 1000ML POUR BTL (IV SOLUTION) ×4 IMPLANT
PACK ABDOMINAL GYN (CUSTOM PROCEDURE TRAY) ×4 IMPLANT
PAD OB MATERNITY 4.3X12.25 (PERSONAL CARE ITEMS) ×4 IMPLANT
SPONGE LAP 18X18 X RAY DECT (DISPOSABLE) ×8 IMPLANT
STAPLER VISISTAT 35W (STAPLE) ×8 IMPLANT
SUT CHROMIC 0 CT 1 (SUTURE) IMPLANT
SUT PDS AB 0 CTX 60 (SUTURE) ×4 IMPLANT
SUT PDS AB 1 CT  36 (SUTURE) ×4
SUT PDS AB 1 CT 36 (SUTURE) ×4 IMPLANT
SUT PDS AB 5-0 P3 18 (SUTURE) IMPLANT
SUT PLAIN 2 0 XLH (SUTURE) IMPLANT
SUT VIC AB 0 CT1 18XCR BRD8 (SUTURE) ×4 IMPLANT
SUT VIC AB 0 CT1 8-18 (SUTURE) ×4
SUT VIC AB 2-0 CT1 27 (SUTURE) ×2
SUT VIC AB 2-0 CT1 TAPERPNT 27 (SUTURE) ×2 IMPLANT
SUT VIC AB 3-0 CT1 27 (SUTURE) ×2
SUT VIC AB 3-0 CT1 TAPERPNT 27 (SUTURE) ×2 IMPLANT
SUT VIC AB 3-0 PS1 18 (SUTURE)
SUT VIC AB 3-0 PS1 18X BRD (SUTURE) IMPLANT
SUT VICRYL 0 TIES 12 18 (SUTURE) ×4 IMPLANT
SYR CONTROL 10ML LL (SYRINGE) IMPLANT
SYRINGE 60CC LL (MISCELLANEOUS) IMPLANT
TOWEL OR 17X24 6PK STRL BLUE (TOWEL DISPOSABLE) ×8 IMPLANT
TRAY FOLEY CATH 14FR (SET/KITS/TRAYS/PACK) ×4 IMPLANT
WATER STERILE IRR 1000ML POUR (IV SOLUTION) ×4 IMPLANT

## 2014-05-04 NOTE — Anesthesia Postprocedure Evaluation (Addendum)
  Anesthesia Post-op Note  Patient: Kathryn Lara  Procedure(s) Performed: Procedure(s): EXPLORATORY LAPAROTOMY (N/A) CYSTOSCOPY  Patient Location: PACU  Anesthesia Type:General  Level of Consciousness: awake, alert  and oriented  Airway and Oxygen Therapy: Patient Spontanous Breathing  Post-op Pain: mild  Post-op Assessment: Post-op Vital signs reviewed, Patient's Cardiovascular Status Stable, Respiratory Function Stable, Patent Airway, No signs of Nausea or vomiting and Pain level controlled    Post-op Vital Signs: Reviewed and stable  Last Vitals:  Filed Vitals:   05/04/14 1845  BP: 124/64  Pulse: 73  Temp:   Resp: 14    Complications: No apparent anesthesia complications

## 2014-05-04 NOTE — Progress Notes (Signed)
At pt's request, had prayer w/her and husband who was bedside just before they took pt down for surgery. Pt, husband, and staff were thankful for prayer and visit. Will refer pt to wknd Chaplain. Ernest Haber Chaplain  05/04/14 1500  Clinical Encounter Type  Visited With Patient and family together

## 2014-05-04 NOTE — Progress Notes (Signed)
Patient ID: Kathryn Lara, female   DOB: 07/19/1976, 38 y.o.   MRN: [id number] PAIN WORSE. NAUSEATED TEMP 98.5 TMAX 100.6 ABD DISTENDED LIMITED BOWEL SOUNDS VERY TENDER WITH REBOUND TUBO OVARIAN ABSCESS NOW WITH SIGNS OF PERITONITIS PRECEDE WITH EXP SURGERY

## 2014-05-04 NOTE — Progress Notes (Signed)
Patient ID: Kathryn Lara, female   DOB: 08-09-1976, 38 y.o.   MRN: [id number] Patient name  Kathryn Lara, Kathryn Lara DICTATION# [id number] CSN# [id number]  [facility], MD 05/04/2014 6:22 PM

## 2014-05-04 NOTE — Anesthesia Procedure Notes (Signed)
Performed by: Asher Muir

## 2014-05-04 NOTE — Transfer of Care (Signed)
Immediate Anesthesia Transfer of Care Note  Patient: Kathryn Lara  Procedure(s) Performed: Procedure(s): EXPLORATORY LAPAROTOMY (N/A) CYSTOSCOPY  Patient Location: PACU  Anesthesia Type:General  Level of Consciousness: awake  Airway & Oxygen Therapy: Patient Spontanous Breathing  Post-op Assessment: Report given to PACU RN  Post vital signs: stable  Filed Vitals:   05/04/14 1505  BP: 119/69  Pulse: 101  Temp: 36.9 C  Resp: 18    Complications: No apparent anesthesia complications

## 2014-05-04 NOTE — Anesthesia Preprocedure Evaluation (Signed)
Anesthesia Evaluation  Patient identified by MRN, date of birth, ID band Patient awake    Reviewed: Allergy & Precautions, H&P , NPO status , Patient's Chart, lab work & pertinent test results  Airway Mallampati: II TM Distance: >3 FB Neck ROM: Full    Dental no notable dental hx. (+) Teeth Intact   Pulmonary former smoker,  breath sounds clear to auscultation  Pulmonary exam normal       Cardiovascular negative cardio ROS  Rhythm:Regular Rate:Normal     Neuro/Psych negative neurological ROS  negative psych ROS   GI/Hepatic negative GI ROS, Neg liver ROS,   Endo/Other  negative endocrine ROS  Renal/GU negative Renal ROS  negative genitourinary   Musculoskeletal negative musculoskeletal ROS (+)   Abdominal   Peds  Hematology  (+) anemia ,   Anesthesia Other Findings   Reproductive/Obstetrics Ovarian abscess Peritonitis Infertility                           Anesthesia Physical Anesthesia Plan  ASA: II and emergent  Anesthesia Plan: General   Post-op Pain Management:    Induction: Intravenous, Rapid sequence and Cricoid pressure planned  Airway Management Planned: Oral ETT  Additional Equipment:   Intra-op Plan:   Post-operative Plan: Extubation in OR  Informed Consent: I have reviewed the patients History and Physical, chart, labs and discussed the procedure including the risks, benefits and alternatives for the proposed anesthesia with the patient or authorized representative who has indicated his/her understanding and acceptance.   Dental advisory given  Plan Discussed with: CRNA, Anesthesiologist and Surgeon  Anesthesia Plan Comments:         Anesthesia Quick Evaluation

## 2014-05-04 NOTE — Progress Notes (Signed)
Patient ID: Kathryn Lara, female   DOB: 1975/12/20, 38 y.o.   MRN: [id number] Nelle Don to the operative note on Mac[facility]. After the abdomen was completely closed. The patient was placed in the dorsal lithotomy position. Cystoscopy was then performed. The patient been given methylene blue. Visualization of the ureteral for orifice did reveal the spillage of blue tinged urine. Thus indicating both ureters were intact. The bladder was also intact. Cystoscope was then removed at this point. Foley was placed to straight drain. The patient taken out of dorsal lithotomy position. Once extubated was transferred to recovery in good condition. Sponge instrument and needle count prescribed by circulating nurse x2.

## 2014-05-04 NOTE — Op Note (Signed)
04/30/2014 - 05/04/2014  5:00 PM  PATIENT:  Kathryn Lara  38 y.o. female  Patient Care Team: Carlena Hurl, PA-C as PCP - General (Family Medicine)  PRE-OPERATIVE DIAGNOSIS:  ovarian abscess  POST-OPERATIVE DIAGNOSIS:  same  PROCEDURE:  Procedure(s): EXPLORATORY LAPAROTOMY  SURGEON:  Leighton Ruff, MD  ANESTHESIA:   general  EBL:  Total I/O In: 5485.4 [I.V.:4335.4; IV Piggyback:1150] Out: 7035 [Urine:1250; Blood:400]   INDICATION: Called to OR by Dr Radene Knee to evaluate inflamed appendix.  Pt is s/p excision of TOA.     OR FINDINGS: secondarily inflamed appendix, edematous mesentery  DESCRIPTION: When I arrived to the operating room, the patient was s/p excision of her R adnexa for TOA.  There was a lower transverse incision.  Visualization was good.  There was good hemostasis.  The appendiceal tip was inflamed secondarily, but there was no crepitus or signs of necrosis.  The base of the appendix was non-inflamed.  I did not see a reason to remove this.  The cecum was secondarily inflamed as well.  The sigmoid colon appeared to be fixed under this in the pelvis, also with signs of secondary inflammation.  All organ structures visualized appeared to be without injury.  I then left the OR to let Dr Radene Knee finish the procedure.

## 2014-05-04 NOTE — Progress Notes (Signed)
Patient ID: Kathryn Lara, female   DOB: 10-05-1976, 38 y.o.   MRN: [id number] Discussed exploratory surgery with possible tah bsop Risk discussed.  Infection,  Hemorrhage that could require trasnsfusions with risk of aids and hepatitis,   Risk of injury to adjacent organs that could require further surgery.  Risk of DVT andPE.

## 2014-05-05 LAB — CBC
HCT: 24 % — ABNORMAL LOW (ref 36.0–46.0)
Hemoglobin: 8.2 g/dL — ABNORMAL LOW (ref 12.0–15.0)
MCH: 29.3 pg (ref 26.0–34.0)
MCHC: 34.2 g/dL (ref 30.0–36.0)
MCV: 85.7 fL (ref 78.0–100.0)
PLATELETS: 303 10*3/uL (ref 150–400)
RBC: 2.8 MIL/uL — AB (ref 3.87–5.11)
RDW: 13.3 % (ref 11.5–15.5)
WBC: 22.3 10*3/uL — ABNORMAL HIGH (ref 4.0–10.5)

## 2014-05-05 LAB — COMPREHENSIVE METABOLIC PANEL
ALT: 7 U/L (ref 0–35)
AST: 15 U/L (ref 0–37)
Albumin: 1.6 g/dL — ABNORMAL LOW (ref 3.5–5.2)
Alkaline Phosphatase: 70 U/L (ref 39–117)
Anion gap: 8 (ref 5–15)
BILIRUBIN TOTAL: 0.7 mg/dL (ref 0.3–1.2)
BUN: <3 mg/dL — ABNORMAL LOW (ref 6–23)
CHLORIDE: 97 meq/L (ref 96–112)
CO2: 31 mEq/L (ref 19–32)
Calcium: 7.9 mg/dL — ABNORMAL LOW (ref 8.4–10.5)
Creatinine, Ser: 0.64 mg/dL (ref 0.50–1.10)
GFR calc Af Amer: >90 mL/min (ref >90)
GFR calc non Af Amer: >90 mL/min (ref >90)
Glucose, Bld: 110 mg/dL — ABNORMAL HIGH (ref 70–99)
POTASSIUM: 4.2 meq/L (ref 3.7–5.3)
SODIUM: 136 meq/L — AB (ref 137–147)
Total Protein: 4.4 g/dL — ABNORMAL LOW (ref 6.0–8.3)

## 2014-05-05 NOTE — Plan of Care (Signed)
Problem: Phase II Progression Outcomes Goal: Progress activity as tolerated unless otherwise ordered Outcome: Completed/Met Date Met:  05/05/14 Tolerated walk in hall.

## 2014-05-05 NOTE — Progress Notes (Signed)
1 Day Post-Op Procedure(s) (LRB): EXPLORATORY LAPAROTOMY (N/A) CYSTOSCOPY  Subjective: Patient reports incisional pain.    Objective: I have reviewed patient's vital signs and labs.  General: alert GI: soft, non-tender; bowel sounds normal; no masses,  no organomegaly and incision: clean  Assessment: s/p Procedure(s): EXPLORATORY LAPAROTOMY (N/A) CYSTOSCOPY: stable  Plan: Advance diet  LOS: 5 days    Jakylan Ron S 05/05/2014, 8:03 AM

## 2014-05-05 NOTE — Anesthesia Postprocedure Evaluation (Signed)
Anesthesia Post Note  Patient: Kathryn Lara  Procedure(s) Performed: Procedure(s) (LRB): EXPLORATORY LAPAROTOMY (N/A) CYSTOSCOPY  Anesthesia type: General  Patient location: [facility]  Post pain: Pain level controlled  Post assessment: Post-op Vital signs reviewed  Last Vitals:  Filed Vitals:   05/05/14 0548  BP:   Pulse:   Temp:   Resp: 20    Post vital signs: Reviewed  Level of consciousness: sedated  Complications: No apparent anesthesia complications

## 2014-05-05 NOTE — Plan of Care (Signed)
Problem: Phase I Progression Outcomes Goal: Pain controlled with appropriate interventions Outcome: Completed/Met Date Met:  05/05/14 Pain controlled at this time by PCA Dilaudid.Per patient. Goal: Dangle/OOB as tolerated per MD order Outcome: Completed/Met Date Met:  05/05/14 Walked in hall and sat up in chair and tolerated well. Goal: IS, TCDB as ordered Outcome: Completed/Met Date Met:  05/05/14 Can get I/S up to 1250

## 2014-05-05 NOTE — Addendum Note (Signed)
Addendum created 05/05/14 0914 by Asher Muir, CRNA   Modules edited: Notes Section   Notes Section:  File: [id number]

## 2014-05-05 NOTE — Progress Notes (Addendum)
Chaplain visit while rounding on referral of Chaplain Holder.  Kathryn Lara reports that she is doing fine after her procedure and is confident that the problem with her ovary has been dealt with successfully. She states she has not spiritual or emotional pain related to the procedure and appreciates being visited.  Sallee Lange. Vlada Uriostegui, [place]

## 2014-05-05 NOTE — Op Note (Signed)
NAMEPRUDENCE, Kathryn Lara          ACCOUNT NO.:  [id number]  MEDICAL RECORD NO.:  [id number]  LOCATION:  9311                          FACILITY:  [facility]  PHYSICIAN:  Darlyn Chamber, M.D.   DATE OF BIRTH:  [birth year]  DATE OF PROCEDURE:  05/04/2014 DATE OF DISCHARGE:                              OPERATIVE REPORT   PREOPERATIVE DIAGNOSIS:  Right tube ovarian abscess with worsening peritonitis.  POSTOPERATIVE DIAGNOSIS:  Right tube ovarian abscess with worsening peritonitis.  OPERATIVE PROCEDURE:  Exploratory surgery with lysis of adhesions. Right salpingo-oophorectomy.  Cystoscopy.  SURGEON:  Darlyn Chamber, M.D.  ANESTHESIA:  General.  ASSISTANT:  Marylynn Pearson, MD  ANESTHESIA:  General endotracheal.  ESTIMATED BLOOD LOSS:  700 mL.  PACKS:  None.  DRAINS:  Urethral Foley.  INTRAOPERATIVE BLOOD PLACED:  None.  COMPLICATIONS:  None.  INDICATION:  As follow.  The patient is a 37 year old female who underwent a previous D and E.  Subsequently was admitted on July 13th with fever and apparent right tube ovarian abscess.  She was placed on IV antibiotics.  There were attempts at CT drainage, which could not be done due to the solid nature of the mass.  She was maintained on antibiotics.  The morning I came on, she was feeling much, much worse. Her abdomen was extremely tender with peritoneal signs and rebound. Bowel sounds were limited.  She was nauseated with findings of peritonitis.  An ultrasound the day previously continued revealed the right tubo-ovarian abscess.  It was a little smaller, but there was no where we can aspirate.  In view of the peritoneal signs and the fact that her white count had gone up, we decided to proceed with exploratory laparotomy with removal of right tube and ovary.  The risks of surgery explained including the risk of infection.  The risk of hemorrhage could require transfusion with the risk of AIDS or hepatitis.  Risk of injury to  adjacent organs including bladder, bowel, ureters could require further exploratory surgery.  Risk of deep venous thrombosis and pulmonary embolus.  Possibility to have to remove the uterus and both tubes and ovaries were also discussed, this can lead to infertility. The patient appears to understand indications and risks.  DESCRIPTION OF PROCEDURE:  The patient was taken to the OR, placed in a supine position.  After a satisfactory level of general endotracheal anesthesia obtained, the abdomen, perineum, and vagina were prepped out with Betadine.  Foley was placed to straight drain.  The patient was draped in sterile field.  A low-transverse skin incision was made with a knife, the incision was extended through the subcutaneous tissue. Fascia was entered sharply, incision fashioned laterally.  Rectus muscles were separated in the midline.  We had trouble entering the peritoneum, this seemed to be thickened, this may have been inflammatory, but we could not rule out bowel.  We moved laterally to the right side.  We were able to enter the peritoneum and then dissect further.  With this, we realized that this was just thickened peritoneum and not true bowel.  We were able to dissect the uterus free from the cul-de-sac.  We had trouble elevating the right tube and ovary.  We extended the incision laterally on both sides, and we also cut into the muscle belly of the rectus muscles.  With this, we had better visualization.  A O'Connor-O'Sullivan retractor was put in place. Visualization revealed the uterus to be of normal size and shape, had some inflammatory material on it, we were able to raise up the right ovary.  It was enlarged with a large inflammatory complex.  The tube was dilated and also involved in this complex.  Left ovary was dilated and inflamed.  Left ovary appeared to be normal.  Bowel was inflamed, but did not appear to be involved in the process.  We did visualize a markedly  thickened appendix.  General surgeon was called and visualized this and felt it was inflammatory in nature and could be left alone.  At this point in time, we elevated the right tube and ovary.  We developed the retroperitoneal space on the right side trying to identify the ureter.  It felt like it was deep into the pelvis.  We isolated the ovarian vasculature above this, clamped and cut it.  This was secured with 2 free ties of 0-Vicryl and suture ligature of 0-Vicryl.  We then dissected the ovary and tube up to the uterus.  We then using a clamp, cutting, and suture of 3-0 Vicryl, we were able to separate the remaining tube and thickened utero-ovarian pedicle from the uterus.  The tube and ovary were passed off the operative field.  We irrigated the pelvis, had good hemostasis.  At this point in time, the surgeon came in and looked at the tube and felt it was okay.  We further irrigated the pelvis, we had good hemostasis and clear urine output.  Again the left tube was no inflamed, but we could see the fimbriated end.  The left ovary was normal.  We had some inflammatory material on the back of the uterus, we removed that, again irrigated the pelvis, had good hemostasis.  At this point in time, peritoneum was closed with running suture of 2-0 Vicryl.  The fascia was closed with a double-looped suture of 0-PDS.  Subcu was closed with a running suture of 0-plain catgut. Skin was closed with staples.  Cystoscopy was then performed.  Both ureters were identified.  She had been given methylene blue, however, we saw good jets of urine coming from both ureteral orifices.  The cystoscopy was discontinued.  Foley was placed to straight drain.  Sponge, instrument, and needle count was reported as correct by the circulating nurse multiple times.  The patient was taken out of the dorsal lithotomy position.  Once alert and extubated, transferred to recovery room in good condition.     Darlyn Chamber, M.D.     JSM/MEDQ  D:  05/04/2014  T:  05/05/2014  Job:  [id number]

## 2014-05-06 LAB — CBC
HEMATOCRIT: 21.6 % — AB (ref 36.0–46.0)
Hemoglobin: 7.3 g/dL — ABNORMAL LOW (ref 12.0–15.0)
MCH: 28.9 pg (ref 26.0–34.0)
MCHC: 33.8 g/dL (ref 30.0–36.0)
MCV: 85.4 fL (ref 78.0–100.0)
PLATELETS: 308 10*3/uL (ref 150–400)
RBC: 2.53 MIL/uL — ABNORMAL LOW (ref 3.87–5.11)
RDW: 13.2 % (ref 11.5–15.5)
WBC: 24.2 10*3/uL — ABNORMAL HIGH (ref 4.0–10.5)

## 2014-05-06 MED ORDER — FERROUS SULFATE 325 (65 FE) MG PO TABS
325.0000 mg | ORAL_TABLET | Freq: Three times a day (TID) | ORAL | Status: DC
  Filled 2014-05-06 (×2): qty 1

## 2014-05-06 MED ORDER — BISACODYL 10 MG RE SUPP
10.0000 mg | Freq: Every day | RECTAL | Status: DC | PRN
  Administered 2014-05-06: 10 mg via RECTAL
  Filled 2014-05-06: qty 1

## 2014-05-06 MED ORDER — PROMETHAZINE HCL 25 MG/ML IJ SOLN: 12.5000 mg | INTRAMUSCULAR | Status: DC | PRN

## 2014-05-06 MED ORDER — AMOXICILLIN-POT CLAVULANATE 500-125 MG PO TABS
1.0000 | ORAL_TABLET | Freq: Three times a day (TID) | ORAL | Status: DC
  Administered 2014-05-06 (×2): 500 mg via ORAL
  Filled 2014-05-06 (×4): qty 1

## 2014-05-06 NOTE — Progress Notes (Signed)
Subjective: Patient reports tolerating PO and no problems voiding.    Objective: I have reviewed patient's vital signs.  General: alert GI: soft, non-tender; bowel sounds normal; no masses,  no organomegaly and incision: clean   Assessment/Plan: Post op RSO D/c iv oral antibiotics d/c in am   LOS: 6 days    Aran Menning S 05/06/2014, 10:42 AM

## 2014-05-06 NOTE — Progress Notes (Signed)
Patient refused to take Iron supplements.   Discussed hemoglobin with patient and importance of taking iron.  Provided patient with handouts regarding iron rich foods.  Leighton Roach, RN--------------------

## 2014-05-07 ENCOUNTER — Encounter (HOSPITAL_COMMUNITY): Payer: Self-pay | Admitting: Obstetrics and Gynecology

## 2014-05-07 LAB — CBC WITH DIFFERENTIAL/PLATELET
BASOS ABS: 0.1 10*3/uL (ref 0.0–0.1)
Basophils Relative: 0 % (ref 0–1)
EOS ABS: 0.1 10*3/uL (ref 0.0–0.7)
Eosinophils Relative: 0 % (ref 0–5)
HCT: 24.3 % — ABNORMAL LOW (ref 36.0–46.0)
Hemoglobin: 8.2 g/dL — ABNORMAL LOW (ref 12.0–15.0)
Lymphocytes Relative: 9 % — ABNORMAL LOW (ref 12–46)
Lymphs Abs: 2.8 10*3/uL (ref 0.7–4.0)
MCH: 28.8 pg (ref 26.0–34.0)
MCHC: 33.7 g/dL (ref 30.0–36.0)
MCV: 85.3 fL (ref 78.0–100.0)
Monocytes Absolute: 2 10*3/uL — ABNORMAL HIGH (ref 0.1–1.0)
Monocytes Relative: 6 % (ref 3–12)
NEUTROS ABS: 27.5 10*3/uL — AB (ref 1.7–7.7)
Neutrophils Relative %: 85 % — ABNORMAL HIGH (ref 43–77)
Platelets: 429 10*3/uL — ABNORMAL HIGH (ref 150–400)
RBC: 2.85 MIL/uL — ABNORMAL LOW (ref 3.87–5.11)
RDW: 13.4 % (ref 11.5–15.5)
WBC: 32.4 10*3/uL — ABNORMAL HIGH (ref 4.0–10.5)

## 2014-05-07 MED ORDER — AMOXICILLIN-POT CLAVULANATE 250-62.5 MG/5ML PO SUSR
500.0000 mg | Freq: Three times a day (TID) | ORAL | Status: DC
  Filled 2014-05-07 (×2): qty 10

## 2014-05-07 MED ORDER — ONDANSETRON 8 MG PO TBDP
8.0000 mg | ORAL_TABLET | Freq: Three times a day (TID) | ORAL | Status: DC | PRN
  Administered 2014-05-07 – 2014-05-08 (×2): 8 mg via ORAL
  Filled 2014-05-07: qty 1

## 2014-05-07 MED ORDER — AMOXICILLIN-POT CLAVULANATE 200-28.5 MG/5ML PO SUSR
500.0000 mg | Freq: Three times a day (TID) | ORAL | Status: DC
  Administered 2014-05-07 – 2014-05-08 (×4): 500 mg via ORAL
  Filled 2014-05-07 (×7): qty 15

## 2014-05-07 MED ORDER — AMOXICILLIN-POT CLAVULANATE 250-62.5 MG/5ML PO SUSR: 500.0000 mg | Freq: Three times a day (TID) | ORAL | Status: DC

## 2014-05-07 NOTE — Progress Notes (Signed)
Tolerating po.  Emesis x 1 yesterday.  +Flatus and BM.  Voiding without difficulty.  Pain well-controlled with po meds.  Ambulating regularly.    VSS.  AF. AM labs pending  Gen: A&O x 3 Abd: soft, minimally distended, hypoactive BS, honeycomb c/d Ext: no c/c/e  POD#3 s/p RSO for TOA -Continue po abx -Reevaluate this pm for possible discharge   Linda Hedges, DO

## 2014-05-07 NOTE — Progress Notes (Signed)
I offered bereavement support to Terrin and let her know about resources, such as [facility] and [facility] (for her 39 year old daughter).  She is also aware that we remain a resource for her here.  [employer] Pager, [phone] 2:55 PM   05/07/14 1400  Clinical Encounter Type  Visited With Patient  Visit Type Spiritual support  Referral From Chaplain;Nurse  Spiritual Encounters  Spiritual Needs Grief support;Emotional

## 2014-05-07 NOTE — Progress Notes (Signed)
C/O mild nausea.  Pain well controlled.  No emesis today.    VSS. AF. WBC 24.2->32.4  Abd: soft, ND  POD#3 s/p RSO -Will tx nausea -Reassess for d/c in AM -Rpt CBC in AM  Linda Hedges, DO

## 2014-05-08 LAB — TYPE AND SCREEN
ABO/RH(D): O POS
ANTIBODY SCREEN: NEGATIVE
Unit division: 0
Unit division: 0

## 2014-05-08 LAB — CBC WITH DIFFERENTIAL/PLATELET
BASOS ABS: 0 10*3/uL (ref 0.0–0.1)
BASOS PCT: 0 % (ref 0–1)
EOS ABS: 0.1 10*3/uL (ref 0.0–0.7)
Eosinophils Relative: 0 % (ref 0–5)
HCT: 23 % — ABNORMAL LOW (ref 36.0–46.0)
HEMOGLOBIN: 7.9 g/dL — AB (ref 12.0–15.0)
Lymphocytes Relative: 11 % — ABNORMAL LOW (ref 12–46)
Lymphs Abs: 3 10*3/uL (ref 0.7–4.0)
MCH: 29.2 pg (ref 26.0–34.0)
MCHC: 34.3 g/dL (ref 30.0–36.0)
MCV: 84.9 fL (ref 78.0–100.0)
MONOS PCT: 8 % (ref 3–12)
Monocytes Absolute: 2.4 10*3/uL — ABNORMAL HIGH (ref 0.1–1.0)
NEUTROS ABS: 23.1 10*3/uL — AB (ref 1.7–7.7)
NEUTROS PCT: 81 % — AB (ref 43–77)
PLATELETS: 438 10*3/uL — AB (ref 150–400)
RBC: 2.71 MIL/uL — ABNORMAL LOW (ref 3.87–5.11)
RDW: 13.7 % (ref 11.5–15.5)
WBC: 28.6 10*3/uL — ABNORMAL HIGH (ref 4.0–10.5)

## 2014-05-08 MED ORDER — IBUPROFEN 600 MG PO TABS: 600.0000 mg | ORAL_TABLET | Freq: Four times a day (QID) | ORAL | Status: DC | PRN

## 2014-05-08 MED ORDER — AMOXICILLIN-POT CLAVULANATE 200-28.5 MG/5ML PO SUSR: 500.0000 mg | Freq: Three times a day (TID) | ORAL | Status: AC

## 2014-05-08 MED ORDER — OXYCODONE-ACETAMINOPHEN 5-325 MG PO TABS: 1.0000 | ORAL_TABLET | ORAL | Status: DC | PRN

## 2014-05-08 NOTE — Discharge Instructions (Signed)
Call MD for T>100.4, severe abdominal pain, intractable nausea and/or vomiting, or respiratory distress.  Call office to schedule incision check Friday with Dr. Lynnette Caffey.  No driving while taking narcotics.  No heavy lifting.

## 2014-05-08 NOTE — Progress Notes (Signed)
Patient reports feeling better this pm.  No nausea today and has been eating and drinking.  Would like d/c home.

## 2014-05-08 NOTE — Progress Notes (Signed)
4 Days Post-Op Procedure(s) (LRB): EXPLORATORY LAPAROTOMY (N/A) CYSTOSCOPY  Subjective: Patient reports nausea, incisional pain, tolerating PO, + flatus, + BM and no problems voiding.    Objective: I have reviewed patient's vital signs, intake and output, medications and labs. WBC count fell from 32.4->28.6  General: alert, cooperative and appears stated age GI: soft, non-tender; bowel sounds normal; no masses,  no organomegaly and incision: clean, dry and intact Extremities: extremities normal, atraumatic, no cyanosis or edema  Assessment: s/p Procedure(s): EXPLORATORY LAPAROTOMY (N/A) CYSTOSCOPY: stable  Plan: Encourage ambulation Patient has d/c'd po pain meds as she suspects nausea caused by them.  Will reevaluate symptoms later today and possibly d/c.   LOS: 8 days    Compton Brigance 05/08/2014, 7:57 AM

## 2014-05-08 NOTE — Progress Notes (Signed)
Pt discharged home with significant other... Discharge instructions reviewed with pt, and she verbalized understanding... Condition stable... No equipment... Taken to car via wheelchair by T. Corbitt, Therapist, sports.

## 2014-05-08 NOTE — Discharge Summary (Signed)
Physician Discharge Summary  Patient ID: Trachelle Low MRN: [id number] DOB/AGE: Dec 03, 1975 38 y.o.  Admit date: 04/30/2014 Discharge date: 05/08/2014  Admission Diagnoses:  Right tubo-ovarian abscess  Discharge Diagnoses:   SAA Active Problems:   Abdominal pain, lower   Pelvic abscess in female   S/P exploratory laparotomy   Discharged Condition: good  Hospital Course: Patient was admitted with abdominal pain.  Imaging revealed right TOA.  Conservative treatment with IV antibiotics failed.  Interventional radiology was unable to drain.  The patient clinically worsened and was taken to the OR on HD#5 for laparotomy with RSO.  The patients postoperative course was uncomplicated.  The patient slowly regained bowel function and on POD#4 was sent home in good condition.  Consults: general surgery  Significant Diagnostic Studies: n/a  Treatments: surgery: Abdominal LSO  Discharge Exam: Blood pressure 124/73, pulse 92, temperature 98.8 F (37.1 C), temperature source Oral, resp. rate 16, height 5\' 4"  (1.626 m), weight 158 lb (71.668 kg), last menstrual period 02/22/2014, SpO2 98.00%. General appearance: alert, cooperative and appears stated age GI: Appropritate tenderness to palpation, soft, ND Incision/Wound: C/D/I  Disposition: 01-Home or Self Care     Medication List         amoxicillin-clavulanate 200-28.5 MG/5ML suspension  Commonly known as:  AUGMENTIN  Take 12.5 mLs (500 mg total) by mouth every 8 (eight) hours.     ibuprofen 600 MG tablet  Commonly known as:  ADVIL  Take 1 tablet (600 mg total) by mouth every 6 (six) hours as needed.     oxyCODONE-acetaminophen 5-325 MG per tablet  Commonly known as:  PERCOCET/ROXICET  Take 1-2 tablets by mouth every 4 (four) hours as needed for severe pain (moderate to severe pain (when tolerating fluids)).         SignedLinda Hedges 05/08/2014, 1:59 PM

## 2014-05-19 ENCOUNTER — Encounter (HOSPITAL_COMMUNITY): Payer: Self-pay | Admitting: Emergency Medicine

## 2014-05-19 DIAGNOSIS — Y929 Unspecified place or not applicable: Secondary | ICD-10-CM

## 2014-05-19 DIAGNOSIS — R1084 Generalized abdominal pain: Secondary | ICD-10-CM | POA: Diagnosis present

## 2014-05-19 DIAGNOSIS — Y838 Other surgical procedures as the cause of abnormal reaction of the patient, or of later complication, without mention of misadventure at the time of the procedure: Secondary | ICD-10-CM | POA: Diagnosis present

## 2014-05-19 DIAGNOSIS — IMO0002 Reserved for concepts with insufficient information to code with codable children: Principal | ICD-10-CM | POA: Diagnosis present

## 2014-05-19 DIAGNOSIS — Z8249 Family history of ischemic heart disease and other diseases of the circulatory system: Secondary | ICD-10-CM

## 2014-05-19 DIAGNOSIS — G8918 Other acute postprocedural pain: Secondary | ICD-10-CM | POA: Diagnosis present

## 2014-05-19 DIAGNOSIS — Z823 Family history of stroke: Secondary | ICD-10-CM

## 2014-05-19 DIAGNOSIS — Z87891 Personal history of nicotine dependence: Secondary | ICD-10-CM

## 2014-05-19 LAB — CBC WITH DIFFERENTIAL/PLATELET
Basophils Absolute: 0 10*3/uL (ref 0.0–0.1)
Basophils Relative: 0 % (ref 0–1)
EOS ABS: 0.2 10*3/uL (ref 0.0–0.7)
Eosinophils Relative: 2 % (ref 0–5)
HCT: 29.9 % — ABNORMAL LOW (ref 36.0–46.0)
Hemoglobin: 9.5 g/dL — ABNORMAL LOW (ref 12.0–15.0)
LYMPHS PCT: 30 % (ref 12–46)
Lymphs Abs: 3 10*3/uL (ref 0.7–4.0)
MCH: 27.9 pg (ref 26.0–34.0)
MCHC: 31.8 g/dL (ref 30.0–36.0)
MCV: 87.9 fL (ref 78.0–100.0)
MONOS PCT: 6 % (ref 3–12)
Monocytes Absolute: 0.6 10*3/uL (ref 0.1–1.0)
NEUTROS PCT: 62 % (ref 43–77)
Neutro Abs: 6.1 10*3/uL (ref 1.7–7.7)
PLATELETS: 699 10*3/uL — AB (ref 150–400)
RBC: 3.4 MIL/uL — AB (ref 3.87–5.11)
RDW: 16.1 % — ABNORMAL HIGH (ref 11.5–15.5)
WBC: 9.9 10*3/uL (ref 4.0–10.5)

## 2014-05-19 LAB — COMPREHENSIVE METABOLIC PANEL
ALT: 16 U/L (ref 0–35)
ANION GAP: 15 (ref 5–15)
AST: 17 U/L (ref 0–37)
Albumin: 3.3 g/dL — ABNORMAL LOW (ref 3.5–5.2)
Alkaline Phosphatase: 94 U/L (ref 39–117)
BILIRUBIN TOTAL: 0.3 mg/dL (ref 0.3–1.2)
BUN: 7 mg/dL (ref 6–23)
CO2: 26 mEq/L (ref 19–32)
Calcium: 9.6 mg/dL (ref 8.4–10.5)
Chloride: 95 mEq/L — ABNORMAL LOW (ref 96–112)
Creatinine, Ser: 0.59 mg/dL (ref 0.50–1.10)
GFR calc non Af Amer: >90 mL/min (ref >90)
Glucose, Bld: 87 mg/dL (ref 70–99)
POTASSIUM: 4.2 meq/L (ref 3.7–5.3)
SODIUM: 136 meq/L — AB (ref 137–147)
TOTAL PROTEIN: 8.5 g/dL — AB (ref 6.0–8.3)

## 2014-05-19 NOTE — ED Notes (Signed)
Patient here with complaint of abdominal pain starting about 2 days ago. Had surgery for an ovarian abscess about 2 weeks ago. Left ovary and fallopian tube was removed at that time. Patient has been taking pain medications but hasn't had any today. Took laxative. States that she had a small bowel movement today. Reports left mid abdomen pain and tenderness. Endorses nausea, denies diarrhea, reports some bright red blood in stool. States that she has a hemorrhoid.

## 2014-05-20 ENCOUNTER — Emergency Department (HOSPITAL_COMMUNITY): Payer: BC Managed Care – PPO

## 2014-05-20 ENCOUNTER — Inpatient Hospital Stay (HOSPITAL_COMMUNITY)
Admission: EM | Admit: 2014-05-20 | Discharge: 2014-05-22 | DRG: 921 | Disposition: A | Discharge status: Home / Self Care | Payer: BC Managed Care – PPO | Attending: Obstetrics and Gynecology | Admitting: Obstetrics and Gynecology

## 2014-05-20 ENCOUNTER — Encounter (HOSPITAL_COMMUNITY): Payer: Self-pay

## 2014-05-20 DIAGNOSIS — K651 Peritoneal abscess: Secondary | ICD-10-CM

## 2014-05-20 DIAGNOSIS — IMO0001 Reserved for inherently not codable concepts without codable children: Secondary | ICD-10-CM

## 2014-05-20 DIAGNOSIS — N739 Female pelvic inflammatory disease, unspecified: Secondary | ICD-10-CM | POA: Diagnosis present

## 2014-05-20 DIAGNOSIS — T814XXA Infection following a procedure, initial encounter: Secondary | ICD-10-CM

## 2014-05-20 LAB — URINALYSIS, ROUTINE W REFLEX MICROSCOPIC
Bilirubin Urine: NEGATIVE
GLUCOSE, UA: NEGATIVE mg/dL
Hgb urine dipstick: NEGATIVE
Ketones, ur: NEGATIVE mg/dL
LEUKOCYTES UA: NEGATIVE
Nitrite: NEGATIVE
PROTEIN: NEGATIVE mg/dL
Specific Gravity, Urine: 1.02 (ref 1.005–1.030)
Urobilinogen, UA: 0.2 mg/dL (ref 0.0–1.0)
pH: 6 (ref 5.0–8.0)

## 2014-05-20 LAB — PREGNANCY, URINE: Preg Test, Ur: NEGATIVE

## 2014-05-20 MED ORDER — PIPERACILLIN-TAZOBACTAM 3.375 G IVPB
3.3750 g | Freq: Three times a day (TID) | INTRAVENOUS | Status: DC
  Filled 2014-05-20 (×2): qty 50

## 2014-05-20 MED ORDER — MORPHINE SULFATE (PF) 1 MG/ML IV SOLN
INTRAVENOUS | Status: DC
  Administered 2014-05-20: 07:00:00 via INTRAVENOUS
  Filled 2014-05-20: qty 25

## 2014-05-20 MED ORDER — NALOXONE HCL 0.4 MG/ML IJ SOLN: 0.4000 mg | INTRAMUSCULAR | Status: DC | PRN

## 2014-05-20 MED ORDER — DIPHENHYDRAMINE HCL 12.5 MG/5ML PO ELIX: 12.5000 mg | ORAL_SOLUTION | Freq: Four times a day (QID) | ORAL | Status: DC | PRN

## 2014-05-20 MED ORDER — ONDANSETRON HCL 4 MG/2ML IJ SOLN: 4.0000 mg | Freq: Four times a day (QID) | INTRAMUSCULAR | Status: DC | PRN

## 2014-05-20 MED ORDER — IOHEXOL 300 MG/ML  SOLN
25.0000 mL | Freq: Once | INTRAMUSCULAR | Status: AC | PRN
  Administered 2014-05-20: 25 mL via ORAL

## 2014-05-20 MED ORDER — FENTANYL CITRATE 0.05 MG/ML IJ SOLN
50.0000 ug | Freq: Once | INTRAMUSCULAR | Status: AC
  Administered 2014-05-20: 50 ug via INTRAVENOUS
  Filled 2014-05-20: qty 2

## 2014-05-20 MED ORDER — OXYCODONE-ACETAMINOPHEN 5-325 MG PO TABS
1.0000 | ORAL_TABLET | ORAL | Status: DC | PRN
  Administered 2014-05-20 (×2): 1 via ORAL
  Administered 2014-05-21 – 2014-05-22 (×4): 2 via ORAL
  Administered 2014-05-22: 1 via ORAL
  Filled 2014-05-20 (×6): qty 2
  Filled 2014-05-20: qty 1

## 2014-05-20 MED ORDER — ONDANSETRON HCL 4 MG/2ML IJ SOLN
4.0000 mg | Freq: Once | INTRAMUSCULAR | Status: AC
  Administered 2014-05-20: 4 mg via INTRAVENOUS
  Filled 2014-05-20: qty 2

## 2014-05-20 MED ORDER — DIPHENHYDRAMINE HCL 50 MG/ML IJ SOLN: 12.5000 mg | Freq: Four times a day (QID) | INTRAMUSCULAR | Status: DC | PRN

## 2014-05-20 MED ORDER — PIPERACILLIN-TAZOBACTAM 3.375 G IVPB 30 MIN: 3.3750 g | Freq: Four times a day (QID) | INTRAVENOUS | Status: DC

## 2014-05-20 MED ORDER — IOHEXOL 300 MG/ML  SOLN
100.0000 mL | Freq: Once | INTRAMUSCULAR | Status: AC | PRN
  Administered 2014-05-20: 100 mL via INTRAVENOUS

## 2014-05-20 MED ORDER — SODIUM CHLORIDE 0.9 % IJ SOLN: 9.0000 mL | INTRAMUSCULAR | Status: DC | PRN

## 2014-05-20 MED ORDER — DEXTROSE IN LACTATED RINGERS 5 % IV SOLN
INTRAVENOUS | Status: DC
  Administered 2014-05-20 – 2014-05-21 (×6): via INTRAVENOUS

## 2014-05-20 MED ORDER — PIPERACILLIN-TAZOBACTAM 3.375 G IVPB
3.3750 g | Freq: Three times a day (TID) | INTRAVENOUS | Status: DC
  Administered 2014-05-20 – 2014-05-22 (×7): 3.375 g via INTRAVENOUS
  Filled 2014-05-20 (×7): qty 50

## 2014-05-20 NOTE — H&P (Signed)
Kathryn Lara is an 38 y.o. female. Who underwent D and E 3 weeks ago, was then adm for pelvic infection, failed ABX, underwent EL with RSO, GS was consulted intraop to eval appendix, but felt inflammation was secondary and it was left in situ.  She responded to post op ABX and was D/C on PO ABX.  Her postop visit last week was normal until she began to exp incr abd pain X 24 hrs and presented to [facility] ED tonight.  CT there showed multiple areas of concern for abscess>>adm now for IV ABX  Pertinent Gynecological History: Menses: flow is moderate Bleeding: hx infert Contraception: none DES exposure: unknown Blood transfusions: none Sexually transmitted diseases: unk Previous GYN Procedures: D and E/  RSO  Last mammogram: unk Date:  Last pap: unk Dat   Menstrual History:  Patient's last menstrual period was 02/22/2014.    Past Medical History  Diagnosis Date  . Missed abortion   . Wears glasses   . Infertility, female     Past Surgical History  Procedure Laterality Date  . Wisdom tooth extraction    . Ovarian egg retrieval  03-09-2014  . Dilation and curettage of uterus N/A 04/17/2014    Procedure: SUCTION DILATATION AND CURETTAGE;  Surgeon: Governor Specking, MD;  Location: [facility];  Service: Gynecology;  Laterality: N/A;  . Laparotomy N/A 05/04/2014    Procedure: EXPLORATORY LAPAROTOMY;  Surgeon: Darlyn Chamber, MD;  Location: [facility];  Service: Gynecology;  Laterality: N/A;  . Cystoscopy  05/04/2014    Procedure: CYSTOSCOPY;  Surgeon: Darlyn Chamber, MD;  Location: [facility];  Service: Gynecology;;    Family History  Problem Relation Age of Onset  . Heart disease Neg Hx   . Stroke Neg Hx     Social History:  reports that she quit smoking about 4 months ago. Her smoking use included Cigarettes. She smoked 0.00 packs per day. She has never used smokeless tobacco. She reports that she does not drink alcohol or use illicit drugs.  Allergies:  Allergies   Allergen Reactions  . Sulfa Antibiotics Hives    Prescriptions prior to admission  Medication Sig Dispense Refill  . ibuprofen (ADVIL) 600 MG tablet Take 1 tablet (600 mg total) by mouth every 6 (six) hours as needed.  30 tablet  1  . oxyCODONE-acetaminophen (PERCOCET/ROXICET) 5-325 MG per tablet Take 1-2 tablets by mouth every 4 (four) hours as needed for severe pain (moderate to severe pain (when tolerating fluids)).  30 tablet  0    Review of Systems  Constitutional: Negative for fever and chills.  Gastrointestinal: Positive for abdominal pain.    Blood pressure 122/76, pulse 76, temperature 98.1 F (36.7 C), temperature source Oral, resp. rate 18, last menstrual period 02/22/2014, SpO2 100.00%. Physical Exam  Constitutional: She is oriented to person, place, and time. She appears well-nourished.  HENT:  Head: Normocephalic and atraumatic.  Neck: Normal range of motion. Neck supple.  Cardiovascular: Normal rate and regular rhythm.   Respiratory: Effort normal.  GI:  Tender mid>lower abd, no rebound, hypoactive BS  Inc intact, healing normally  Musculoskeletal: Normal range of motion.  Neurological: She is alert and oriented to person, place, and time.  Skin: Skin is warm and dry.      Results for orders placed during the hospital encounter of 05/20/14 (from the past 24 hour(s))  CBC WITH DIFFERENTIAL     Status: Abnormal   Collection Time    05/19/14 10:00 PM  Result Value Ref Range   WBC 9.9  4.0 - 10.5 K/uL   RBC 3.40 (*) 3.87 - 5.11 MIL/uL   Hemoglobin 9.5 (*) 12.0 - 15.0 g/dL   HCT 29.9 (*) 36.0 - 46.0 %   MCV 87.9  78.0 - 100.0 fL   MCH 27.9  26.0 - 34.0 pg   MCHC 31.8  30.0 - 36.0 g/dL   RDW 16.1 (*) 11.5 - 15.5 %   Platelets 699 (*) 150 - 400 K/uL   Neutrophils Relative % 62  43 - 77 %   Neutro Abs 6.1  1.7 - 7.7 K/uL   Lymphocytes Relative 30  12 - 46 %   Lymphs Abs 3.0  0.7 - 4.0 K/uL   Monocytes Relative 6  3 - 12 %   Monocytes Absolute 0.6   0.1 - 1.0 K/uL   Eosinophils Relative 2  0 - 5 %   Eosinophils Absolute 0.2  0.0 - 0.7 K/uL   Basophils Relative 0  0 - 1 %   Basophils Absolute 0.0  0.0 - 0.1 K/uL  COMPREHENSIVE METABOLIC PANEL     Status: Abnormal   Collection Time    05/19/14 10:00 PM      Result Value Ref Range   Sodium 136 (*) 137 - 147 mEq/L   Potassium 4.2  3.7 - 5.3 mEq/L   Chloride 95 (*) 96 - 112 mEq/L   CO2 26  19 - 32 mEq/L   Glucose, Bld 87  70 - 99 mg/dL   BUN 7  6 - 23 mg/dL   Creatinine, Ser 0.59  0.50 - 1.10 mg/dL   Calcium 9.6  8.4 - 10.5 mg/dL   Total Protein 8.5 (*) 6.0 - 8.3 g/dL   Albumin 3.3 (*) 3.5 - 5.2 g/dL   AST 17  0 - 37 U/L   ALT 16  0 - 35 U/L   Alkaline Phosphatase 94  39 - 117 U/L   Total Bilirubin 0.3  0.3 - 1.2 mg/dL   GFR calc non Af Amer >90  >90 mL/min   GFR calc Af Amer >90  >90 mL/min   Anion gap 15  5 - 15  URINALYSIS, ROUTINE W REFLEX MICROSCOPIC     Status: None   Collection Time    05/20/14  2:04 AM      Result Value Ref Range   Color, Urine YELLOW  YELLOW   APPearance CLEAR  CLEAR   Specific Gravity, Urine 1.020  1.005 - 1.030   pH 6.0  5.0 - 8.0   Glucose, UA NEGATIVE  NEGATIVE mg/dL   Hgb urine dipstick NEGATIVE  NEGATIVE   Bilirubin Urine NEGATIVE  NEGATIVE   Ketones, ur NEGATIVE  NEGATIVE mg/dL   Protein, ur NEGATIVE  NEGATIVE mg/dL   Urobilinogen, UA 0.2  0.0 - 1.0 mg/dL   Nitrite NEGATIVE  NEGATIVE   Leukocytes, UA NEGATIVE  NEGATIVE  PREGNANCY, URINE     Status: None   Collection Time    05/20/14  2:04 AM      Result Value Ref Range   Preg Test, Ur NEGATIVE  NEGATIVE    Ct Abdomen Pelvis W Contrast  05/20/2014   CLINICAL DATA:  Abdominal pain and constipation. Left-sided abdominal pain. Recent excision of right ovary.  EXAM: CT ABDOMEN AND PELVIS WITH CONTRAST  TECHNIQUE: Multidetector CT imaging of the abdomen and pelvis was performed using the standard protocol following bolus administration of intravenous contrast.  CONTRAST:  143mL  OMNIPAQUE  IOHEXOL 300 MG/ML  SOLN  COMPARISON:  04/30/2014  FINDINGS: Lung bases are clear.  The liver, spleen, gallbladder, pancreas, adrenal glands, kidneys, abdominal aorta, inferior vena cava, and retroperitoneal lymph nodes are unremarkable. Stomach, small bowel, and colon are normal for degree of distention. Stool fills the colon.  There is an ovoid loculated fluid collection in the right abdomen underneath the anterior liver edge measuring 4.1 x 1.4 cm. There is peripheral enhancement. There is another loculated fluid collection in the right anterior pelvis measuring 5.3 x 3.3 cm. Left pelvic fluid collection measuring 4.1 x 10.4 cm. Fluid collection in the anterior pelvic wall at the linea alba and rectus abdominus muscles measuring 3.5 by 0.8 cm. Do peripheral enhancement and loculation of these collections, abscess is suspected. No free air.  Pelvis: Uterus is not enlarged. Bladder wall is not thickened. No free pelvic fluid. The appendix appears distended and may be inflamed. Appendiceal diameter measures 11 mm. Mild. Appendiceal fluid. It is possible in this could be reactive change related to the abdominal and pelvic abscesses. Stool-filled colon. No evidence of diverticulitis. No destructive bone lesions.  IMPRESSION: Multiple loculated abdominal and pelvic fluid collections as discussed, worrisome for abscesses. Appendix is also distended with mild periappendiceal infiltration which could represent appendicitis or reactive inflammation.   Electronically Signed   By: Lucienne Capers M.D.   On: 05/20/2014 03:20    Assessment/Plan: Intra abd infection as noted on CT above, will adm for IV ABX  Terrance Lanahan M 05/20/2014, 5:32 AM

## 2014-05-20 NOTE — ED Provider Notes (Signed)
CSN: [id number]     Arrival date & time 05/19/14  2138 History   First MD Initiated Contact with Patient 05/20/14 0055     Chief Complaint  Patient presents with  . Abdominal Pain  . Constipation     (Consider location/radiation/quality/duration/timing/severity/associated sxs/prior Treatment) HPI 38 year old female presents to the emergency department with complaint of diffuse abdominal pain, worse in left side ongoing for last 2 days.  Patient status post D&C on June 30 for missed AB, followed by exploratory laparoscopy on July 17 for tubo-ovarian abscess on right with resection of right ovary and fallopian tube.  Patient reports that she has been taking her medications as prescribed.  She has been on stool softeners and has been having soft stools until today when she had a slightly more formed stool.  She denies any fever or chills.  She reports she's been eating normally.  The pain has been steadily increasing, and has concerned her for return of infection. Past Medical History  Diagnosis Date  . Missed abortion   . Wears glasses   . Infertility, female    Past Surgical History  Procedure Laterality Date  . Wisdom tooth extraction    . Ovarian egg retrieval  03-09-2014  . Dilation and curettage of uterus N/A 04/17/2014    Procedure: SUCTION DILATATION AND CURETTAGE;  Surgeon: Governor Specking, MD;  Location: [place];  Service: Gynecology;  Laterality: N/A;  . Laparotomy N/A 05/04/2014    Procedure: EXPLORATORY LAPAROTOMY;  Surgeon: Darlyn Chamber, MD;  Location: [facility];  Service: Gynecology;  Laterality: N/A;  . Cystoscopy  05/04/2014    Procedure: CYSTOSCOPY;  Surgeon: Darlyn Chamber, MD;  Location: [facility];  Service: Gynecology;;   Family History  Problem Relation Age of Onset  . Heart disease Neg Hx   . Stroke Neg Hx    History  Substance Use Topics  . Smoking status: Former Smoker    Types: Cigarettes    Quit date: 01/14/2014  . Smokeless tobacco: Never  Used     Comment: WAS A SOCIAL SMOKER  . Alcohol Use: No     Comment: OCCASIONAL   OB History   Grav Para Term Preterm Abortions TAB SAB Ect Mult Living                 Review of Systems  See History of Present Illness; otherwise all other systems are reviewed and negative   Allergies  Sulfa antibiotics  Home Medications   Prior to Admission medications   Medication Sig Start Date End Date Taking? Authorizing Provider  ibuprofen (ADVIL) 600 MG tablet Take 1 tablet (600 mg total) by mouth every 6 (six) hours as needed. 05/08/14  Yes Megan Morris, DO  oxyCODONE-acetaminophen (PERCOCET/ROXICET) 5-325 MG per tablet Take 1-2 tablets by mouth every 4 (four) hours as needed for severe pain (moderate to severe pain (when tolerating fluids)). 05/08/14  Yes Megan Morris, DO   BP 110/58  Pulse 77  Temp(Src) 99.2 F (37.3 C) (Oral)  Resp 20  SpO2 100%  LMP 02/22/2014 Physical Exam  Nursing note and vitals reviewed. Constitutional: She is oriented to person, place, and time. She appears well-developed and well-nourished.  HENT:  Head: Normocephalic and atraumatic.  Nose: Nose normal.  Mouth/Throat: Oropharynx is clear and moist.  Eyes: Conjunctivae and EOM are normal. Pupils are equal, round, and reactive to light.  Neck: Normal range of motion. Neck supple. No JVD present. No tracheal deviation present. No thyromegaly present.  Cardiovascular: Normal rate, regular rhythm, normal heart sounds and intact distal pulses.  Exam reveals no gallop and no friction rub.   No murmur heard. Pulmonary/Chest: Effort normal and breath sounds normal. No stridor. No respiratory distress. She has no wheezes. She has no rales. She exhibits no tenderness.  Abdominal: Soft. Bowel sounds are normal. She exhibits no distension and no mass. There is tenderness (patient is exquisitely tenderto palpation over entire abdomen, worse in left upper quadrant and right upper quadrant). There is no rebound and no  guarding.  Musculoskeletal: Normal range of motion. She exhibits no edema and no tenderness.  Lymphadenopathy:    She has no cervical adenopathy.  Neurological: She is alert and oriented to person, place, and time. She exhibits normal muscle tone. Coordination normal.  Skin: Skin is warm and dry. No rash noted. No erythema. No pallor.  Psychiatric: She has a normal mood and affect. Her behavior is normal. Judgment and thought content normal.    ED Course  Procedures (including critical care time) Labs Review Labs Reviewed  CBC WITH DIFFERENTIAL - Abnormal; Notable for the following:    RBC 3.40 (*)    Hemoglobin 9.5 (*)    HCT 29.9 (*)    RDW 16.1 (*)    Platelets 699 (*)    All other components within normal limits  COMPREHENSIVE METABOLIC PANEL - Abnormal; Notable for the following:    Sodium 136 (*)    Chloride 95 (*)    Total Protein 8.5 (*)    Albumin 3.3 (*)    All other components within normal limits  URINE CULTURE  URINALYSIS, ROUTINE W REFLEX MICROSCOPIC  PREGNANCY, URINE    Imaging Review Ct Abdomen Pelvis W Contrast  05/20/2014   CLINICAL DATA:  Abdominal pain and constipation. Left-sided abdominal pain. Recent excision of right ovary.  EXAM: CT ABDOMEN AND PELVIS WITH CONTRAST  TECHNIQUE: Multidetector CT imaging of the abdomen and pelvis was performed using the standard protocol following bolus administration of intravenous contrast.  CONTRAST:  150mL OMNIPAQUE IOHEXOL 300 MG/ML  SOLN  COMPARISON:  04/30/2014  FINDINGS: Lung bases are clear.  The liver, spleen, gallbladder, pancreas, adrenal glands, kidneys, abdominal aorta, inferior vena cava, and retroperitoneal lymph nodes are unremarkable. Stomach, small bowel, and colon are normal for degree of distention. Stool fills the colon.  There is an ovoid loculated fluid collection in the right abdomen underneath the anterior liver edge measuring 4.1 x 1.4 cm. There is peripheral enhancement. There is another loculated  fluid collection in the right anterior pelvis measuring 5.3 x 3.3 cm. Left pelvic fluid collection measuring 4.1 x 10.4 cm. Fluid collection in the anterior pelvic wall at the linea alba and rectus abdominus muscles measuring 3.5 by 0.8 cm. Do peripheral enhancement and loculation of these collections, abscess is suspected. No free air.  Pelvis: Uterus is not enlarged. Bladder wall is not thickened. No free pelvic fluid. The appendix appears distended and may be inflamed. Appendiceal diameter measures 11 mm. Mild. Appendiceal fluid. It is possible in this could be reactive change related to the abdominal and pelvic abscesses. Stool-filled colon. No evidence of diverticulitis. No destructive bone lesions.  IMPRESSION: Multiple loculated abdominal and pelvic fluid collections as discussed, worrisome for abscesses. Appendix is also distended with mild periappendiceal infiltration which could represent appendicitis or reactive inflammation.   Electronically Signed   By: Lucienne Capers M.D.   On: 05/20/2014 03:20     EKG Interpretation None  MDM   Final diagnoses:  Abscess, intra-abdominal, postoperative, initial encounter   38 year old female with recent D&C and exploratory laparoscopy with return of abdominal pain.  No fevers, white blood cell count is returning to normal.  Patient is significantly tender on exam, however.  CT scan shows multiple loculated abdominal and pelvic fluid collections concerning for abscess.  Will consult surgery for their evaluation.   3:49 AM D/w Dr Ninfa Linden, he wishes GYN contacted, and will be available for consult.  4:11 AM D/w Dr Matthew Saras on call for GYN, he requests transfer of patient to [facility] for evaluation.   Kalman Drape, MD 05/20/14 [phone]

## 2014-05-20 NOTE — ED Notes (Signed)
Pt transported via [facility] transfer. This RN gave report to [employer].

## 2014-05-21 ENCOUNTER — Ambulatory Visit (HOSPITAL_COMMUNITY)
Admit: 2014-05-21 | Discharge: 2014-05-21 | Disposition: A | Discharge status: Home / Self Care | Payer: BC Managed Care – PPO | Attending: Obstetrics & Gynecology | Admitting: Obstetrics & Gynecology

## 2014-05-21 ENCOUNTER — Ambulatory Visit (HOSPITAL_COMMUNITY): Payer: BC Managed Care – PPO

## 2014-05-21 LAB — CBC
HCT: 27.3 % — ABNORMAL LOW (ref 36.0–46.0)
Hemoglobin: 8.6 g/dL — ABNORMAL LOW (ref 12.0–15.0)
MCH: 27.9 pg (ref 26.0–34.0)
MCHC: 31.5 g/dL (ref 30.0–36.0)
MCV: 88.6 fL (ref 78.0–100.0)
Platelets: 441 10*3/uL — ABNORMAL HIGH (ref 150–400)
RBC: 3.08 MIL/uL — ABNORMAL LOW (ref 3.87–5.11)
RDW: 15.9 % — ABNORMAL HIGH (ref 11.5–15.5)
WBC: 7.2 10*3/uL (ref 4.0–10.5)

## 2014-05-21 LAB — COMPREHENSIVE METABOLIC PANEL
ALK PHOS: 77 U/L (ref 39–117)
ALT: 10 U/L (ref 0–35)
AST: 11 U/L (ref 0–37)
Albumin: 2.7 g/dL — ABNORMAL LOW (ref 3.5–5.2)
Anion gap: 7 (ref 5–15)
BUN: 8 mg/dL (ref 6–23)
CO2: 31 meq/L (ref 19–32)
Calcium: 9.1 mg/dL (ref 8.4–10.5)
Chloride: 98 mEq/L (ref 96–112)
Creatinine, Ser: 0.76 mg/dL (ref 0.50–1.10)
Glucose, Bld: 102 mg/dL — ABNORMAL HIGH (ref 70–99)
Potassium: 4.3 mEq/L (ref 3.7–5.3)
Sodium: 136 mEq/L — ABNORMAL LOW (ref 137–147)
TOTAL PROTEIN: 7.1 g/dL (ref 6.0–8.3)
Total Bilirubin: 0.2 mg/dL — ABNORMAL LOW (ref 0.3–1.2)

## 2014-05-21 LAB — URINE CULTURE: Colony Count: 9000

## 2014-05-21 MED ORDER — FENTANYL CITRATE 0.05 MG/ML IJ SOLN
INTRAMUSCULAR | Status: AC | PRN
  Administered 2014-05-21 (×3): 25 ug via INTRAVENOUS
  Administered 2014-05-21: 50 ug via INTRAVENOUS
  Administered 2014-05-21: 25 ug via INTRAVENOUS

## 2014-05-21 MED ORDER — MIDAZOLAM HCL 2 MG/2ML IJ SOLN
INTRAMUSCULAR | Status: AC
  Filled 2014-05-21: qty 4

## 2014-05-21 MED ORDER — FENTANYL CITRATE 0.05 MG/ML IJ SOLN
INTRAMUSCULAR | Status: AC
  Filled 2014-05-21: qty 4

## 2014-05-21 MED ORDER — POLYETHYLENE GLYCOL 3350 17 G PO PACK
17.0000 g | PACK | Freq: Every day | ORAL | Status: DC
  Filled 2014-05-21 (×3): qty 1

## 2014-05-21 MED ORDER — MIDAZOLAM HCL 2 MG/2ML IJ SOLN
INTRAMUSCULAR | Status: AC | PRN
  Administered 2014-05-21: 1 mg via INTRAVENOUS
  Administered 2014-05-21 (×3): 0.5 mg via INTRAVENOUS

## 2014-05-21 NOTE — Sedation Documentation (Signed)
[facility] here to pick up pt, report given to [facility], Therapist, sports.  Pt denies pain. Last vitals stable.

## 2014-05-21 NOTE — Sedation Documentation (Signed)
Pain med given for increased pain w/ drainage tube insertion. VSS. Pt tolerating well.  Awake

## 2014-05-21 NOTE — Progress Notes (Signed)
Patient reports feeling better.  She has not been febrile since last admission.  Tolerating po with good appetite.  Voiding without difficulty.  Slowed but present bowel function (last BM this AM).  No nausea or vomiting.    VSS.  AF. WBC 7  Gen: A&O x 3 Abd: soft, minimal tenderness to palpation. No rebound or guarding.  Ext: no c/c/e  37yo with ? Abdominal/pelvic abscesses -Continue IV Zosyn -Will call radiologist to review CT for possible drainage  Linda Hedges, DO

## 2014-05-21 NOTE — Sedation Documentation (Signed)
Zosyn infusing from transfer from [facility]

## 2014-05-21 NOTE — Sedation Documentation (Signed)
Patient is resting comfortably, denied discomfort when asked. VSS.

## 2014-05-21 NOTE — Plan of Care (Signed)
Problem: Phase II Progression Outcomes Goal: Discharge plan established Outcome: Completed/Met Date Met:  05/21/14 VSS Pain controlled Infection resolved  Problem: Phase III Progression Outcomes Goal: Pain controlled on oral analgesia Outcome: Completed/Met Date Met:  05/21/14 Good pain control on po Percocet. Goal: Activity at appropriate level-compared to baseline (UP IN CHAIR FOR HEMODIALYSIS)  Outcome: Completed/Met Date Met:  05/21/14 Tolerated ambulating in hall well.    Goal: Voiding independently Outcome: Completed/Met Date Met:  05/21/14 Voiding qs amt clear yellow urine w/o difficulty.

## 2014-05-21 NOTE — Progress Notes (Signed)
Patient reports improved pain s/p IR drainage.  Per radiologist, fluid collections were old blood therefore no drain was left in place.  Will continue IV abx and change over to po augmentin (liquid, per pt request) tomorrow.    Linda Hedges, DO

## 2014-05-21 NOTE — Sedation Documentation (Signed)
Dr. Laurence Ferrari at bedside discussing outcome of procedure with pt. Questions answered.

## 2014-05-21 NOTE — Progress Notes (Signed)
Patient ID: Kathryn Lara, female   DOB: Nov 05, 1975, 38 y.o.   MRN: [id number] Request received for CT guided aspiration/drainage of pelvic fluid collection(s)/? abscesses in pt s/p recent D&C and right TOA excision. Imaging studies were reviewed by Dr. Laurence Ferrari. Addtional PMH as below. Exam: pt awake/alert; chest- CTA bilat; heart- RRR; abd- soft,+BS, tender pelvic region; ext- FROM, no sig edema.   Filed Vitals:   05/21/14 0200 05/21/14 0601 05/21/14 1200 05/21/14 1419  BP: 108/64 104/61 106/64 108/59  Pulse: 80 72 72 79  Temp: 98.3 F (36.8 C) 98 F (36.7 C) 98.2 F (36.8 C)   TempSrc: Oral Oral Oral   Resp: 15 16 16 12   Height:      Weight:      SpO2: 100% 100% 99% 98%   Past Medical History  Diagnosis Date  . Missed abortion   . Wears glasses   . Infertility, female    Past Surgical History  Procedure Laterality Date  . Wisdom tooth extraction    . Ovarian egg retrieval  03-09-2014  . Dilation and curettage of uterus N/A 04/17/2014    Procedure: SUCTION DILATATION AND CURETTAGE;  Surgeon: Governor Specking, MD;  Location: [place];  Service: Gynecology;  Laterality: N/A;  . Laparotomy N/A 05/04/2014    Procedure: EXPLORATORY LAPAROTOMY;  Surgeon: Darlyn Chamber, MD;  Location: [facility];  Service: Gynecology;  Laterality: N/A;  . Cystoscopy  05/04/2014    Procedure: CYSTOSCOPY;  Surgeon: Darlyn Chamber, MD;  Location: [facility];  Service: Gynecology;;   US Transvaginal Non-ob  05/03/2014   CLINICAL DATA:  RIGHT tubo-ovarian abscess, pelvic pain  EXAM: TRANSVAGINAL ULTRASOUND OF PELVIS  TECHNIQUE: Transvaginal ultrasound examination of the pelvis was performed including evaluation of the uterus, ovaries, adnexal regions, and pelvic cul-de-sac.  COMPARISON:  04/30/2014 pelvic ultrasound and CT abdomen/pelvis  FINDINGS: Uterus  Measurements: 8.1 x 4.7 x 4.8 cm. Normal morphology without mass.  Endometrium  Thickness: 4 mm thick, normal. No endometrial fluid or focal  abnormality  Right ovary  Measurements: 3.7 x 2.8 x 3.2 cm. Normal morphology without mass. Internal blood flow present on color Doppler imaging.  Left ovary  Measurements: 4.2 x 3.2 x 2.8 cm. Normal morphology without mass. Internal blood flow present on color Doppler imaging.  Other findings:  Trace free pelvic fluid.  Complex heterogeneous isoechoic to hypoechoic RIGHT adnexal mass immediately adjacent to RIGHT ovary, 7.8 x 5.1 x 5.3 cm, slightly smaller than on the previous exam. This corresponds to the solid and cystic abnormality identified on recent CT. This most likely represents a tubo-ovarian abscess.  No new mass lesions.  IMPRESSION: Normal appearing uterus and ovaries.  Complex RIGHT adnexal mass adjacent to RIGHT ovary, 7.8 x 5.1 x 5.3 cm in size, in combination with CT findings most consistent with a tubo-ovarian abscess, slightly decreased in size since 04/30/2014.  Trace free pelvic fluid.  No new pelvic mass lesions identified.   Electronically Signed   By: Lavonia Dana M.D.   On: 05/03/2014 12:13   US Pelvis Complete  04/30/2014   ADDENDUM REPORT: 04/30/2014 16:06  ADDENDUM: The findings were discussed with MEGAN MORRIS on 04/30/2014 at 4:06 PM.   Electronically Signed   By: Shon Hale M.D.   On: 04/30/2014 16:06   04/30/2014   CLINICAL DATA:  Abdominal pain. Right adnexal mass seen on CT exam. By previous history the patient is status post D & C 2 weeks ago.  EXAM: TRANSABDOMINAL AND  TRANSVAGINAL ULTRASOUND OF PELVIS  TECHNIQUE: Both transabdominal and transvaginal ultrasound examinations of the pelvis were performed. Transabdominal technique was performed for global imaging of the pelvis including uterus, ovaries, adnexal regions, and pelvic cul-de-sac. It was necessary to proceed with endovaginal exam following the transabdominal exam to visualize the adnexal regions.  COMPARISON:  CT of the abdomen and pelvis on 04/30/2014  FINDINGS: Uterus  Measurements: 8.5 x 3.9 x 5.5 cm. No fibroids or  other mass visualized.  Endometrium  Thickness: 1.9 mm.  No focal abnormality visualized.  Right ovary  Within the right adnexa there is a complex mass measuring 7.3 x 7.7 x 5.7 cm. Within this solid mass there are hypoechoic portions. Along the inferior aspect of the mass, possible ovarian tissue is 5.7 x 5.4 x 4.9 cm. Along the more superior portion of the mass, components have a tubular appearance. Blood flow is identified within the mass on color Doppler evaluation. The appearance of the mass and presence of blood flow would make ovarian torsion less likely.  Left ovary  Measurements: 3.9 x 2.7 x 3.3 cm. Normal appearance/no adnexal mass.  Other findings  A small amount of free pelvic fluid is identified.  IMPRESSION: 1. Complex solid right adnexal mass, favored to represent a combination of ovarian and tubal elements. Considerations include tubo-ovarian abscess, ectopic pregnancy, ovarian, or tubal mass. Malignancy cannot be excluded but is considered less likely. 2. Given the solid appearance of the mass, followup is needed. Depending on the clinical scenario, correlation with serial quantitative beta HCG, short-term ultrasound follow-up, pelvic MRI with contrast, or direct visualization may be appropriate. 3. No evidence for retained products of conception. 4. Normal appearance of the left ovary.  Electronically Signed: By: Shon Hale M.D. On: 04/30/2014 15:07   Ct Abdomen Pelvis W Contrast  05/20/2014   CLINICAL DATA:  Abdominal pain and constipation. Left-sided abdominal pain. Recent excision of right ovary.  EXAM: CT ABDOMEN AND PELVIS WITH CONTRAST  TECHNIQUE: Multidetector CT imaging of the abdomen and pelvis was performed using the standard protocol following bolus administration of intravenous contrast.  CONTRAST:  182mL OMNIPAQUE IOHEXOL 300 MG/ML  SOLN  COMPARISON:  04/30/2014  FINDINGS: Lung bases are clear.  The liver, spleen, gallbladder, pancreas, adrenal glands, kidneys, abdominal aorta,  inferior vena cava, and retroperitoneal lymph nodes are unremarkable. Stomach, small bowel, and colon are normal for degree of distention. Stool fills the colon.  There is an ovoid loculated fluid collection in the right abdomen underneath the anterior liver edge measuring 4.1 x 1.4 cm. There is peripheral enhancement. There is another loculated fluid collection in the right anterior pelvis measuring 5.3 x 3.3 cm. Left pelvic fluid collection measuring 4.1 x 10.4 cm. Fluid collection in the anterior pelvic wall at the linea alba and rectus abdominus muscles measuring 3.5 by 0.8 cm. Do peripheral enhancement and loculation of these collections, abscess is suspected. No free air.  Pelvis: Uterus is not enlarged. Bladder wall is not thickened. No free pelvic fluid. The appendix appears distended and may be inflamed. Appendiceal diameter measures 11 mm. Mild. Appendiceal fluid. It is possible in this could be reactive change related to the abdominal and pelvic abscesses. Stool-filled colon. No evidence of diverticulitis. No destructive bone lesions.  IMPRESSION: Multiple loculated abdominal and pelvic fluid collections as discussed, worrisome for abscesses. Appendix is also distended with mild periappendiceal infiltration which could represent appendicitis or reactive inflammation.   Electronically Signed   By: Lucienne Capers M.D.   On:  05/20/2014 03:20   Ct Abdomen Pelvis W Contrast  04/30/2014   CLINICAL DATA:  Severe lower abdominal pain. Status post D and C 2 weeks ago.  EXAM: CT ABDOMEN AND PELVIS WITH CONTRAST  TECHNIQUE: Multidetector CT imaging of the abdomen and pelvis was performed using the standard protocol following bolus administration of intravenous contrast.  CONTRAST:  189mL OMNIPAQUE IOHEXOL 300 MG/ML  SOLN  COMPARISON:  None.  FINDINGS: LUNG BASES: Included view of the lung bases are clear. Visualized heart and pericardium are unremarkable.  SOLID ORGANS: The liver, spleen, gallbladder, pancreas  and adrenal glands are unremarkable.  GASTROINTESTINAL TRACT: The stomach, small and large bowel are normal in course and caliber without inflammatory changes. Normal appendix.  KIDNEYS/ URINARY TRACT: Kidneys are orthotopic, demonstrating symmetric enhancement. No nephrolithiasis, hydronephrosis or renal masses. The unopacified ureters are normal in course and caliber. Delayed imaging through the kidneys demonstrates symmetric prompt excretion to the proximal urinary collecting system. Urinary bladder is partially distended and unremarkable.  PERITONEUM/RETROPERITONEUM: Small amount of diffuse ascites with thickened appearance of the omentum, no intraperitoneal free air. Complex multi-cystic right adnexal 6.5 x 5.4 cm mass with dominant 3 x 2.2 cm cystic component. Irregular surrounding enhancement. Left ovary is unremarkable. Uterus is unremarkable by CT.  SOFT TISSUE/OSSEOUS STRUCTURES: Nonsuspicious.  IMPRESSION: Enlarged 6.5 x 5.4 cm right adnexal with multiple cysts. In addition, small amount of diffuse ascites with inflamed omentum. Constellation of findings could reflect tubo-ovarian abscess, less likely ovarian hyperstimulation syndrome due to unilaterality. Recommend clinical correlation and follow-up ultrasound as clinically indicated.   Electronically Signed   By: Elon Alas   On: 04/30/2014 05:52  Results for orders placed during the hospital encounter of 05/20/14  URINE CULTURE      Result Value Ref Range   Specimen Description URINE, CLEAN CATCH     Special Requests NONE     Culture  Setup Time       Value: 05/20/2014 02:41     Performed at [facility]       Value: 9,000 COLONIES/ML     Performed at [facility]   Culture       Value: INSIGNIFICANT GROWTH     Performed at [facility]   Report Status 05/21/2014 FINAL    CBC WITH DIFFERENTIAL      Result Value Ref Range   WBC 9.9  4.0 - 10.5 K/uL   RBC 3.40 (*) 3.87 - 5.11 MIL/uL    Hemoglobin 9.5 (*) 12.0 - 15.0 g/dL   HCT 29.9 (*) 36.0 - 46.0 %   MCV 87.9  78.0 - 100.0 fL   MCH 27.9  26.0 - 34.0 pg   MCHC 31.8  30.0 - 36.0 g/dL   RDW 16.1 (*) 11.5 - 15.5 %   Platelets 699 (*) 150 - 400 K/uL   Neutrophils Relative % 62  43 - 77 %   Neutro Abs 6.1  1.7 - 7.7 K/uL   Lymphocytes Relative 30  12 - 46 %   Lymphs Abs 3.0  0.7 - 4.0 K/uL   Monocytes Relative 6  3 - 12 %   Monocytes Absolute 0.6  0.1 - 1.0 K/uL   Eosinophils Relative 2  0 - 5 %   Eosinophils Absolute 0.2  0.0 - 0.7 K/uL   Basophils Relative 0  0 - 1 %   Basophils Absolute 0.0  0.0 - 0.1 K/uL  COMPREHENSIVE METABOLIC PANEL  Result Value Ref Range   Sodium 136 (*) 137 - 147 mEq/L   Potassium 4.2  3.7 - 5.3 mEq/L   Chloride 95 (*) 96 - 112 mEq/L   CO2 26  19 - 32 mEq/L   Glucose, Bld 87  70 - 99 mg/dL   BUN 7  6 - 23 mg/dL   Creatinine, Ser 0.59  0.50 - 1.10 mg/dL   Calcium 9.6  8.4 - 10.5 mg/dL   Total Protein 8.5 (*) 6.0 - 8.3 g/dL   Albumin 3.3 (*) 3.5 - 5.2 g/dL   AST 17  0 - 37 U/L   ALT 16  0 - 35 U/L   Alkaline Phosphatase 94  39 - 117 U/L   Total Bilirubin 0.3  0.3 - 1.2 mg/dL   GFR calc non Af Amer >90  >90 mL/min   GFR calc Af Amer >90  >90 mL/min   Anion gap 15  5 - 15  URINALYSIS, ROUTINE W REFLEX MICROSCOPIC      Result Value Ref Range   Color, Urine YELLOW  YELLOW   APPearance CLEAR  CLEAR   Specific Gravity, Urine 1.020  1.005 - 1.030   pH 6.0  5.0 - 8.0   Glucose, UA NEGATIVE  NEGATIVE mg/dL   Hgb urine dipstick NEGATIVE  NEGATIVE   Bilirubin Urine NEGATIVE  NEGATIVE   Ketones, ur NEGATIVE  NEGATIVE mg/dL   Protein, ur NEGATIVE  NEGATIVE mg/dL   Urobilinogen, UA 0.2  0.0 - 1.0 mg/dL   Nitrite NEGATIVE  NEGATIVE   Leukocytes, UA NEGATIVE  NEGATIVE  PREGNANCY, URINE      Result Value Ref Range   Preg Test, Ur NEGATIVE  NEGATIVE  CBC      Result Value Ref Range   WBC 7.2  4.0 - 10.5 K/uL   RBC 3.08 (*) 3.87 - 5.11 MIL/uL   Hemoglobin 8.6 (*) 12.0 - 15.0 g/dL   HCT  27.3 (*) 36.0 - 46.0 %   MCV 88.6  78.0 - 100.0 fL   MCH 27.9  26.0 - 34.0 pg   MCHC 31.5  30.0 - 36.0 g/dL   RDW 15.9 (*) 11.5 - 15.5 %   Platelets 441 (*) 150 - 400 K/uL  COMPREHENSIVE METABOLIC PANEL      Result Value Ref Range   Sodium 136 (*) 137 - 147 mEq/L   Potassium 4.3  3.7 - 5.3 mEq/L   Chloride 98  96 - 112 mEq/L   CO2 31  19 - 32 mEq/L   Glucose, Bld 102 (*) 70 - 99 mg/dL   BUN 8  6 - 23 mg/dL   Creatinine, Ser 0.76  0.50 - 1.10 mg/dL   Calcium 9.1  8.4 - 10.5 mg/dL   Total Protein 7.1  6.0 - 8.3 g/dL   Albumin 2.7 (*) 3.5 - 5.2 g/dL   AST 11  0 - 37 U/L   ALT 10  0 - 35 U/L   Alkaline Phosphatase 77  39 - 117 U/L   Total Bilirubin 0.2 (*) 0.3 - 1.2 mg/dL   GFR calc non Af Amer >90  >90 mL/min   GFR calc Af Amer >90  >90 mL/min   Anion gap 7  5 - 15   A/P: Pt s/p recent D&C and right TOA excision; now with cont pelvic pain and CT finding of multiple loculated abdominal and pelvic fluidcollections  worrisome for abscesses. Appendix is also distended with mild periappendiceal infiltration which could  represent appendicitis  or reactive inflammation. Plan is for CT guided aspiration/possible drainage of pelvic fluid collection(s) today. Details/risks of procedure d/w pt with her understanding and consent.

## 2014-05-21 NOTE — Sedation Documentation (Signed)
[facility] called to transport pt back to [facility].

## 2014-05-21 NOTE — Sedation Documentation (Signed)
V/o from Dr. Laurence Ferrari for pt to eat/drink as tolerated. Reading magazine, eating ice chips. Pt did not want crackers or anything to drink.

## 2014-05-22 MED ORDER — AMOXICILLIN-POT CLAVULANATE 250-62.5 MG/5ML PO SUSR: 500.0000 mg | Freq: Three times a day (TID) | ORAL | Status: DC

## 2014-05-22 MED ORDER — AMOXICILLIN-POT CLAVULANATE 500-125 MG PO TABS
1.0000 | ORAL_TABLET | Freq: Once | ORAL | Status: AC
  Administered 2014-05-22: 500 mg via ORAL
  Filled 2014-05-22: qty 1

## 2014-05-22 NOTE — Progress Notes (Signed)
No current complaints.  Tolerating po.  Ambulating without difficulty.  Voiding well.  No f/c.  Pain improved.  VSS.  AF.  Gen: A&O x 3 Abd: soft, ND, inc and drainage sites c/d/i Ext: no c/c/e  HD#3 with abdominal/pelvic post-op hematomas  -D/C home today -Continue pain control -Po abx x 1 week

## 2014-05-22 NOTE — Progress Notes (Signed)
Discharge teaching complete. Pt understood all instructions and did not have any questions. Pt ambulated out of the hospital and discharged home to family.  

## 2014-05-22 NOTE — Discharge Summary (Signed)
Physician Discharge Summary  Patient ID: Kathryn Lara MRN: [id number] DOB/AGE: 1976/03/25 38 y.o.  Admit date: 05/20/2014 Discharge date: 05/22/2014  Admission Diagnoses: 38 yo with pain, postop fluid collections  Discharge Diagnoses: SAA with postop hematomas Active Problems:   Pelvic infection in female   Discharged Condition: good  Hospital Course: Patient initially presented to [facility] ED secondary to worsened postop pain.  CT showed 3 fluid collections suspicious for abscesses.  She was transferred to [facility] for IV abx and management.  IR was consulted for possible drain placement.  Drainage was consistent with hematoma and was not left in place but fluid was removed.  The patient had improved pain.  She was discharged home on HD#3.  Consults: Interventional radiology  Significant Diagnostic Studies: labs: WBC 7.2   Treatments: CT guided hematoma aspiration  Discharge Exam: Blood pressure 108/64, pulse 73, temperature 98.3 F (36.8 C), temperature source Oral, resp. rate 18, height 5\' 4"  (1.626 m), weight 160 lb (72.576 kg), last menstrual period 02/22/2014, SpO2 100.00%. General appearance: alert, cooperative and appears stated age GI: soft, non-tender; bowel sounds normal; no masses,  no organomegaly Extremities: extremities normal, atraumatic, no cyanosis or edema Incision/Wound:c/d/i  Disposition: 01-Home or Self Care     Medication List         amoxicillin-clavulanate 250-62.5 MG/5ML suspension  Commonly known as:  AUGMENTIN  Take 10 mLs (500 mg total) by mouth every 8 (eight) hours.     ibuprofen 600 MG tablet  Commonly known as:  ADVIL  Take 1 tablet (600 mg total) by mouth every 6 (six) hours as needed.     oxyCODONE-acetaminophen 5-325 MG per tablet  Commonly known as:  PERCOCET/ROXICET  Take 1-2 tablets by mouth every 4 (four) hours as needed for severe pain (moderate to severe pain (when tolerating fluids)).         SignedLinda Hedges 05/22/2014, 9:02 AM

## 2014-05-22 NOTE — Discharge Instructions (Signed)
Call MD for T>100.4, severe abdominal pain, intractable nausea and/or vomiting, or respiratory distress.  No driving while taking narcotics.  Follow-up in office as scheduled.

## 2014-05-24 LAB — WOUND CULTURE: CULTURE: NO GROWTH

## 2014-06-09 ENCOUNTER — Encounter (HOSPITAL_COMMUNITY): Payer: Self-pay

## 2014-06-09 ENCOUNTER — Inpatient Hospital Stay (HOSPITAL_COMMUNITY)
Admission: AD | Admit: 2014-06-09 | Discharge: 2014-06-09 | Disposition: A | Discharge status: Home / Self Care | Payer: BC Managed Care – PPO | Source: Ambulatory Visit | Attending: Obstetrics and Gynecology | Admitting: Obstetrics and Gynecology

## 2014-06-09 DIAGNOSIS — Z87891 Personal history of nicotine dependence: Secondary | ICD-10-CM | POA: Insufficient documentation

## 2014-06-09 DIAGNOSIS — R142 Eructation: Secondary | ICD-10-CM

## 2014-06-09 DIAGNOSIS — R141 Gas pain: Secondary | ICD-10-CM

## 2014-06-09 DIAGNOSIS — K5900 Constipation, unspecified: Secondary | ICD-10-CM | POA: Insufficient documentation

## 2014-06-09 DIAGNOSIS — R143 Flatulence: Secondary | ICD-10-CM

## 2014-06-09 HISTORY — DX: Salpingitis and oophoritis, unspecified: N70.93

## 2014-06-09 MED ORDER — HYDROCORTISONE ACE-PRAMOXINE 1-1 % RE FOAM
1.0000 | Freq: Two times a day (BID) | RECTAL | Status: DC
Start: 2014-06-09 — End: 2015-04-28

## 2014-06-09 NOTE — MAU Provider Note (Signed)
History     CSN: [id number]  Arrival date and time: 06/09/14 1649   First Provider Initiated Contact with Patient 06/09/14 1746      Chief Complaint  Patient presents with  . Constipation   HPI Kathryn Lara is a 38 y.o. G2P1011. She is s/p D&C 6/30 for Mab. She developed a TOA, had percutaneous drain placement that didn't work due to [employer], [employer]. RAO, LOA and cystoscopy was done 7/17. She returned to ED 8/2 with abd pain- had multiple abd and pelvic hematomas and abscesses- CT guided aspiration, she was discharge 8/4. She has been off pain med x 1 wk, has finished her Augmentin.Had her first full meal yesterday. She started vaginal bleeding 3 d ago- ? menses- passing clots, changes 2x/d.  Today she c/o constipation-last nl BM 1 wk ago, has been having small BM every other day. She is taking stool softener 1/d. Used glycerine supp and enema yesterday.  She has to strain, now has hemorrhoids and blood on her stools. She also had upper abd pain, R>L, like when had the hematomas.    Past Medical History  Diagnosis Date  . Missed abortion   . Wears glasses   . Infertility, female   . Tubo-ovarian abscess     Past Surgical History  Procedure Laterality Date  . Wisdom tooth extraction    . Ovarian egg retrieval  03-09-2014  . Dilation and curettage of uterus N/A 04/17/2014    Procedure: SUCTION DILATATION AND CURETTAGE;  Surgeon: Governor Specking, MD;  Location: [place];  Service: Gynecology;  Laterality: N/A;  . Laparotomy N/A 05/04/2014    Procedure: EXPLORATORY LAPAROTOMY;  Surgeon: Darlyn Chamber, MD;  Location: [facility];  Service: Gynecology;  Laterality: N/A;  . Cystoscopy  05/04/2014    Procedure: CYSTOSCOPY;  Surgeon: Darlyn Chamber, MD;  Location: [facility];  Service: Gynecology;;  . Salpingoophorectomy      Right side only    Family History  Problem Relation Age of Onset  . Heart disease Neg Hx   . Stroke Neg Hx     History  Substance Use Topics  .  Smoking status: Former Smoker    Types: Cigarettes    Quit date: 01/14/2014  . Smokeless tobacco: Never Used     Comment: WAS A SOCIAL SMOKER  . Alcohol Use: No     Comment: OCCASIONAL    Allergies:  Allergies  Allergen Reactions  . Sulfa Antibiotics Hives    Prescriptions prior to admission  Medication Sig Dispense Refill  . docusate sodium (COLACE) 100 MG capsule Take 100 mg by mouth daily as needed for mild constipation.      [name] glycerin adult (GLYCERIN ADULT) 2 G SUPP Place 1 suppository rectally once as needed for mild constipation or moderate constipation.      [name] ibuprofen (ADVIL) 600 MG tablet Take 1 tablet (600 mg total) by mouth every 6 (six) hours as needed.  30 tablet  1  . oxyCODONE-acetaminophen (PERCOCET/ROXICET) 5-325 MG per tablet Take 1-2 tablets by mouth every 4 (four) hours as needed for severe pain (moderate to severe pain (when tolerating fluids)).  30 tablet  0  . phenylephrine-shark liver oil-mineral oil-petrolatum (PREPARATION H) 0.25-3-14-71.9 % rectal ointment Place 1 application rectally 2 (two) times daily as needed for hemorrhoids.      . polyethylene glycol (MIRALAX / GLYCOLAX) packet Take 17 g by mouth daily as needed for mild constipation.      . sodium phosphate (  FLEET) enema Place 1 enema rectally once. follow package directions      . amoxicillin-clavulanate (AUGMENTIN) 250-62.5 MG/5ML suspension Take 10 mLs (500 mg total) by mouth every 8 (eight) hours.  150 mL  0    Review of Systems  Constitutional: Negative for fever and chills.  Gastrointestinal: Positive for abdominal pain, constipation and blood in stool. Negative for nausea and vomiting.  Genitourinary: Negative for dysuria, urgency and frequency.   Physical Exam   Blood pressure 117/74, pulse 77, temperature 97.9 F (36.6 C), temperature source Oral, resp. rate 18, last menstrual period 02/22/2014.  Physical Exam  Nursing note and vitals reviewed. Constitutional: She is oriented to  person, place, and time. She appears well-developed and well-nourished.  GI: Soft. She exhibits no distension and no mass. There is tenderness. There is no rebound and no guarding.  Bowel sounds present but decreased  Genitourinary:  Pelvic exam-  Ext fen- nl anatomy, skin intact Vagina- small amt red  bloody drainage Cx- closed Uterus- enlarged, non tender Adn- no masses palp, non tender Rectal- soft stool in rectum, small soft hemorrhoid tags  Musculoskeletal: Normal range of motion.  Neurological: She is alert and oriented to person, place, and time.  Skin: Skin is warm and dry.  Psychiatric: She has a normal mood and affect. Her behavior is normal.    MAU Course  Procedures  MDM   Assessment and Plan  Consulted with Dr Julien Girt. Pt is afebrile, abd soft and non tender. +BS, used enema yesterday. Increase colace or use Miralax. Probiotics. Simethicon for gas. Trial Analax for discomfort- unable to find to Rx. Rx for [facility] Call office Monday am for an appt with Dr Lynnette Caffey that day Call the office with other changes or concerns Kathryn Lara. 06/09/2014, 5:47 PM

## 2014-06-09 NOTE — MAU Note (Signed)
Pt states had surgery 05/14/2014. Had TOA and removed right ovary and fallopian tube. Was told her bowels were affected. Here with constipation. Has not taken any pain medication in the past week. Is having bowel movements, however notes bleeding and "feels weird". Has pressure and urge to defecate constantly. Tried glycerin suppository and enema yesterday. Beginning to feel pain in upper abdomen as well.

## 2014-06-11 ENCOUNTER — Other Ambulatory Visit: Payer: Self-pay | Admitting: Obstetrics and Gynecology

## 2014-08-20 ENCOUNTER — Encounter (HOSPITAL_COMMUNITY): Payer: Self-pay

## 2014-12-19 LAB — OB RESULTS CONSOLE GC/CHLAMYDIA
Chlamydia: NEGATIVE
Gonorrhea: NEGATIVE

## 2014-12-19 LAB — OB RESULTS CONSOLE RPR: RPR: NONREACTIVE

## 2014-12-19 LAB — OB RESULTS CONSOLE RUBELLA ANTIBODY, IGM: RUBELLA: IMMUNE

## 2014-12-19 LAB — OB RESULTS CONSOLE ANTIBODY SCREEN: Antibody Screen: NEGATIVE

## 2014-12-19 LAB — OB RESULTS CONSOLE HIV ANTIBODY (ROUTINE TESTING): HIV: NONREACTIVE

## 2014-12-19 LAB — OB RESULTS CONSOLE ABO/RH: RH TYPE: POSITIVE

## 2014-12-19 LAB — OB RESULTS CONSOLE HEPATITIS B SURFACE ANTIGEN: HEP B S AG: NEGATIVE

## 2014-12-24 ENCOUNTER — Other Ambulatory Visit: Payer: Self-pay | Admitting: Obstetrics & Gynecology

## 2014-12-25 LAB — CYTOLOGY - PAP

## 2015-04-27 ENCOUNTER — Encounter (HOSPITAL_COMMUNITY): Payer: Self-pay

## 2015-04-27 ENCOUNTER — Inpatient Hospital Stay (HOSPITAL_COMMUNITY)
Admission: AD | Admit: 2015-04-27 | Discharge: 2015-04-28 | Disposition: A | Discharge status: Home / Self Care | Payer: BLUE CROSS/BLUE SHIELD | Source: Ambulatory Visit | Attending: Obstetrics and Gynecology | Admitting: Obstetrics and Gynecology

## 2015-04-27 DIAGNOSIS — O219 Vomiting of pregnancy, unspecified: Secondary | ICD-10-CM

## 2015-04-27 DIAGNOSIS — Z87891 Personal history of nicotine dependence: Secondary | ICD-10-CM | POA: Diagnosis not present

## 2015-04-27 DIAGNOSIS — O26893 Other specified pregnancy related conditions, third trimester: Secondary | ICD-10-CM | POA: Diagnosis not present

## 2015-04-27 DIAGNOSIS — R12 Heartburn: Secondary | ICD-10-CM | POA: Insufficient documentation

## 2015-04-27 DIAGNOSIS — O9989 Other specified diseases and conditions complicating pregnancy, childbirth and the puerperium: Secondary | ICD-10-CM | POA: Insufficient documentation

## 2015-04-27 DIAGNOSIS — Z880 Allergy status to penicillin: Secondary | ICD-10-CM | POA: Diagnosis not present

## 2015-04-27 DIAGNOSIS — Z3A27 27 weeks gestation of pregnancy: Secondary | ICD-10-CM | POA: Insufficient documentation

## 2015-04-27 DIAGNOSIS — O212 Late vomiting of pregnancy: Secondary | ICD-10-CM | POA: Diagnosis present

## 2015-04-27 LAB — URINALYSIS, DIPSTICK ONLY
Bilirubin Urine: NEGATIVE
Glucose, UA: NEGATIVE mg/dL
Hgb urine dipstick: NEGATIVE
Ketones, ur: >80 mg/dL — AB
LEUKOCYTES UA: NEGATIVE
NITRITE: NEGATIVE
PROTEIN: 100 mg/dL — AB
Specific Gravity, Urine: >1.03 — ABNORMAL HIGH (ref 1.005–1.030)
UROBILINOGEN UA: 0.2 mg/dL (ref 0.0–1.0)
pH: 6 (ref 5.0–8.0)

## 2015-04-27 MED ORDER — FAMOTIDINE IN NACL 20-0.9 MG/50ML-% IV SOLN
20.0000 mg | Freq: Once | INTRAVENOUS | Status: AC
  Administered 2015-04-28: 20 mg via INTRAVENOUS
  Filled 2015-04-27: qty 50

## 2015-04-27 MED ORDER — ONDANSETRON HCL 4 MG/2ML IJ SOLN
4.0000 mg | Freq: Once | INTRAMUSCULAR | Status: AC
  Administered 2015-04-28: 4 mg via INTRAVENOUS
  Filled 2015-04-27: qty 2

## 2015-04-27 MED ORDER — LACTATED RINGERS IV BOLUS (SEPSIS)
1000.0000 mL | Freq: Once | INTRAVENOUS | Status: AC
  Administered 2015-04-27: 1000 mL via INTRAVENOUS

## 2015-04-27 NOTE — MAU Note (Signed)
Pt also has c/o acid reflux in addition to nausea and vomiting. Has not taken anything for that.

## 2015-04-27 NOTE — MAU Provider Note (Signed)
History     CSN: [id number]  Arrival date and time: 04/27/15 2250   First Provider Initiated Contact with Patient 04/27/15 2332      Chief Complaint  Patient presents with  . Emesis During Pregnancy   HPI Ms. Kathryn Lara is a 39 y.o. G3P1011 at [redacted]w[redacted]d who presents to MAU today with complaint of N/V x 2 today. The patient states that she also have N/V once earlier in the week. She states mild nausea now. She denies diarrhea or constipation but did have loose stools earlier today. She denies any recent change in diet. She states abdominal pain is mild and only prior to emesis. She also endorses frequent heartburn. She has not taken any medication for this. She denies true contractions, but states occasional mild braxton hicks contractions. She denies vaginal bleeding or LOF. She denies complications with the pregnancy. She reports good fetal movement.   OB History    Gravida Para Term Preterm AB TAB SAB Ectopic Multiple Living   3 1 1  1  1   1       Past Medical History  Diagnosis Date  . Missed abortion   . Wears glasses   . Infertility, female   . Tubo-ovarian abscess     Past Surgical History  Procedure Laterality Date  . Wisdom tooth extraction    . Ovarian egg retrieval  03-09-2014  . Dilation and curettage of uterus N/A 04/17/2014    Procedure: SUCTION DILATATION AND CURETTAGE;  Surgeon: Governor Specking, MD;  Location: [place];  Service: Gynecology;  Laterality: N/A;  . Laparotomy N/A 05/04/2014    Procedure: EXPLORATORY LAPAROTOMY;  Surgeon: Darlyn Chamber, MD;  Location: [place];  Service: Gynecology;  Laterality: N/A;  . Cystoscopy  05/04/2014    Procedure: CYSTOSCOPY;  Surgeon: Darlyn Chamber, MD;  Location: [facility];  Service: Gynecology;;  . Salpingoophorectomy      Right side only    Family History  Problem Relation Age of Onset  . Heart disease Neg Hx   . Stroke Neg Hx     History  Substance Use Topics  . Smoking status: Former Smoker     Types: Cigarettes    Quit date: 01/14/2014  . Smokeless tobacco: Never Used     Comment: WAS A SOCIAL SMOKER  . Alcohol Use: No     Comment: OCCASIONAL    Allergies:  Allergies  Allergen Reactions  . Sulfa Antibiotics Hives    Prescriptions prior to admission  Medication Sig Dispense Refill Last Dose  . acetaminophen (TYLENOL) 325 MG tablet Take 650 mg by mouth every 6 (six) hours as needed.   Past Week at Unknown time  . docusate sodium (COLACE) 100 MG capsule Take 100 mg by mouth daily as needed for mild constipation.   Past Month at Unknown time  . Prenatal Vit-Fe Fumarate-FA (PRENATAL MULTIVITAMIN) TABS tablet Take 1 tablet by mouth daily at 12 noon.   04/26/2015 at Unknown time  . amoxicillin-clavulanate (AUGMENTIN) 250-62.5 MG/5ML suspension Take 10 mLs (500 mg total) by mouth every 8 (eight) hours. 150 mL 0 Completed Course at Unknown time  . glycerin adult (GLYCERIN ADULT) 2 G SUPP Place 1 suppository rectally once as needed for mild constipation or moderate constipation.   06/08/2014 at Unknown time  . hydrocortisone-pramoxine (PROCTOFOAM HC) rectal foam Place 1 applicator rectally 2 (two) times daily. 10 g 0   . ibuprofen (ADVIL) 600 MG tablet Take 1 tablet (600 mg total) by  mouth every 6 (six) hours as needed. 30 tablet 1 Past Week at Unknown time  . oxyCODONE-acetaminophen (PERCOCET/ROXICET) 5-325 MG per tablet Take 1-2 tablets by mouth every 4 (four) hours as needed for severe pain (moderate to severe pain (when tolerating fluids)). 30 tablet 0 Past Week at Unknown time  . phenylephrine-shark liver oil-mineral oil-petrolatum (PREPARATION H) 0.25-3-14-71.9 % rectal ointment Place 1 application rectally 2 (two) times daily as needed for hemorrhoids.   06/08/2014 at Unknown time  . polyethylene glycol (MIRALAX / GLYCOLAX) packet Take 17 g by mouth daily as needed for mild constipation.   two weeks  . sodium phosphate (FLEET) enema Place 1 enema rectally once. follow package  directions   06/08/2014 at Unknown time    Review of Systems  Constitutional: Negative for fever and malaise/fatigue.  Gastrointestinal: Positive for nausea and vomiting. Negative for abdominal pain, diarrhea and constipation.  Genitourinary: Negative for dysuria, urgency and frequency.       Neg - vaginal bleeding, discharge, LOF   Physical Exam   Blood pressure 117/64, pulse 73, temperature 98.1 F (36.7 C), temperature source Oral, resp. rate 20, height 5\' 4"  (1.626 m), weight 196 lb (88.905 kg), last menstrual period 02/22/2014, SpO2 95 %.  Physical Exam  Nursing note and vitals reviewed. Constitutional: She is oriented to person, place, and time. She appears well-developed and well-nourished. No distress.  HENT:  Head: Normocephalic and atraumatic.  Cardiovascular: Normal rate.   Respiratory: Effort normal.  GI: Soft. She exhibits no distension and no mass. There is no tenderness. There is no rebound and no guarding.  Neurological: She is alert and oriented to person, place, and time.  Skin: Skin is warm and dry. No erythema.  Psychiatric: She has a normal mood and affect.   Results for orders placed or performed during the hospital encounter of 04/27/15 (from the past 24 hour(s))  Urinalysis, dipstick only     Status: Abnormal   Collection Time: 04/27/15 11:11 PM  Result Value Ref Range   Specific Gravity, Urine >1.030 (H) 1.005 - 1.030   pH 6.0 5.0 - 8.0   Glucose, UA NEGATIVE NEGATIVE mg/dL   Hgb urine dipstick NEGATIVE NEGATIVE   Bilirubin Urine NEGATIVE NEGATIVE   Ketones, ur >80 (A) NEGATIVE mg/dL   Protein, ur 100 (A) NEGATIVE mg/dL   Urobilinogen, UA 0.2 0.0 - 1.0 mg/dL   Nitrite NEGATIVE NEGATIVE   Leukocytes, UA NEGATIVE NEGATIVE  CBC with Differential/Platelet     Status: Abnormal   Collection Time: 04/27/15 11:58 PM  Result Value Ref Range   WBC 19.1 (H) 4.0 - 10.5 K/uL   RBC 3.94 3.87 - 5.11 MIL/uL   Hemoglobin 12.4 12.0 - 15.0 g/dL   HCT 35.5 (L) 36.0 -  46.0 %   MCV 90.1 78.0 - 100.0 fL   MCH 31.5 26.0 - 34.0 pg   MCHC 34.9 30.0 - 36.0 g/dL   RDW 13.4 11.5 - 15.5 %   Platelets 166 150 - 400 K/uL   Neutrophils Relative % 81 (H) 43 - 77 %   Neutro Abs 15.6 (H) 1.7 - 7.7 K/uL   Lymphocytes Relative 13 12 - 46 %   Lymphs Abs 2.4 0.7 - 4.0 K/uL   Monocytes Relative 4 3 - 12 %   Monocytes Absolute 0.8 0.1 - 1.0 K/uL   Eosinophils Relative 2 0 - 5 %   Eosinophils Absolute 0.3 0.0 - 0.7 K/uL   Basophils Relative 0 0 - 1 %  Basophils Absolute 0.0 0.0 - 0.1 K/uL  Comprehensive metabolic panel     Status: Abnormal   Collection Time: 04/27/15 11:58 PM  Result Value Ref Range   Sodium 132 (L) 135 - 145 mmol/L   Potassium 4.1 3.5 - 5.1 mmol/L   Chloride 107 101 - 111 mmol/L   CO2 20 (L) 22 - 32 mmol/L   Glucose, Bld 103 (H) 65 - 99 mg/dL   BUN 6 6 - 20 mg/dL   Creatinine, Ser 0.61 0.44 - 1.00 mg/dL   Calcium 8.5 (L) 8.9 - 10.3 mg/dL   Total Protein 6.2 (L) 6.5 - 8.1 g/dL   Albumin 3.2 (L) 3.5 - 5.0 g/dL   AST 21 15 - 41 U/L   ALT 9 (L) 14 - 54 U/L   Alkaline Phosphatase 106 38 - 126 U/L   Total Bilirubin 0.8 0.3 - 1.2 mg/dL   GFR calc non Af Amer >60 >60 mL/min   GFR calc Af Amer >60 >60 mL/min   Anion gap 5 5 - 15   Fetal Monitoring: Baseline: 130 bpm, moderate variability, + accelerations, no decelerations Contractions: none   MAU Course  Procedures  None  MDM UA today shows significant dehydration IV LR, 4 mg IV Zofran and 20 mg IV Pepcid given  CBC, CMP obtained Patient reports significant improvement in symptoms.  Discussed patient with Dr. Julien Girt. She agrees with plan for discharge with Rx for Prilosec and follow-up in the office this week.   Assessment and Plan  A: SIUP at [redacted]w[redacted]d Heartburn in pregnancy Nausea and vomiting in pregnancy  P: Discharge home Rx for Prilosec given to patient Diet for GERD discussed Patient advised to follow-up with [facility] next week for repeat labs Patient may return  to MAU as needed or if her condition were to change or worsen   Luvenia Redden, PA-C  04/28/2015, 1:14 AM

## 2015-04-27 NOTE — MAU Note (Signed)
Pt may come off fetal monitor per Tomi Bamberger PA-C

## 2015-04-27 NOTE — MAU Note (Addendum)
Had chest cold for 2 days.  Was using Vicks vapor rub and humidifer.  Cold seemed to get better but woke up nauseated this morning. Productive cough, whitish-yellow mucus.  Ate a strawberry and piece of pineapple and vomited within 5 min.  Vomited twice today.  Loose BM twice today but not like diarrhea.  Denies fever.  Baby moving well.  Felt some tightness in abd a few times an hour.  No bleeding. No leaking.

## 2015-04-28 DIAGNOSIS — O26893 Other specified pregnancy related conditions, third trimester: Secondary | ICD-10-CM

## 2015-04-28 DIAGNOSIS — R12 Heartburn: Secondary | ICD-10-CM

## 2015-04-28 LAB — CBC WITH DIFFERENTIAL/PLATELET
Basophils Absolute: 0 10*3/uL (ref 0.0–0.1)
Basophils Relative: 0 % (ref 0–1)
EOS PCT: 2 % (ref 0–5)
Eosinophils Absolute: 0.3 10*3/uL (ref 0.0–0.7)
HEMATOCRIT: 35.5 % — AB (ref 36.0–46.0)
HEMOGLOBIN: 12.4 g/dL (ref 12.0–15.0)
LYMPHS ABS: 2.4 10*3/uL (ref 0.7–4.0)
LYMPHS PCT: 13 % (ref 12–46)
MCH: 31.5 pg (ref 26.0–34.0)
MCHC: 34.9 g/dL (ref 30.0–36.0)
MCV: 90.1 fL (ref 78.0–100.0)
MONOS PCT: 4 % (ref 3–12)
Monocytes Absolute: 0.8 10*3/uL (ref 0.1–1.0)
Neutro Abs: 15.6 10*3/uL — ABNORMAL HIGH (ref 1.7–7.7)
Neutrophils Relative %: 81 % — ABNORMAL HIGH (ref 43–77)
Platelets: 166 10*3/uL (ref 150–400)
RBC: 3.94 MIL/uL (ref 3.87–5.11)
RDW: 13.4 % (ref 11.5–15.5)
WBC: 19.1 10*3/uL — ABNORMAL HIGH (ref 4.0–10.5)

## 2015-04-28 LAB — COMPREHENSIVE METABOLIC PANEL
ALT: 9 U/L — ABNORMAL LOW (ref 14–54)
ANION GAP: 5 (ref 5–15)
AST: 21 U/L (ref 15–41)
Albumin: 3.2 g/dL — ABNORMAL LOW (ref 3.5–5.0)
Alkaline Phosphatase: 106 U/L (ref 38–126)
BILIRUBIN TOTAL: 0.8 mg/dL (ref 0.3–1.2)
BUN: 6 mg/dL (ref 6–20)
CHLORIDE: 107 mmol/L (ref 101–111)
CO2: 20 mmol/L — AB (ref 22–32)
Calcium: 8.5 mg/dL — ABNORMAL LOW (ref 8.9–10.3)
Creatinine, Ser: 0.61 mg/dL (ref 0.44–1.00)
GFR calc Af Amer: >60 mL/min (ref >60)
Glucose, Bld: 103 mg/dL — ABNORMAL HIGH (ref 65–99)
Potassium: 4.1 mmol/L (ref 3.5–5.1)
Sodium: 132 mmol/L — ABNORMAL LOW (ref 135–145)
Total Protein: 6.2 g/dL — ABNORMAL LOW (ref 6.5–8.1)

## 2015-04-28 MED ORDER — OMEPRAZOLE 20 MG PO CPDR: 20.0000 mg | DELAYED_RELEASE_CAPSULE | Freq: Every day | ORAL | Status: DC

## 2015-04-28 NOTE — Discharge Instructions (Signed)
Heartburn During Pregnancy  Heartburn happens when stomach acid goes up into the esophagus. The esophagus is the tube between the mouth and the stomach. This acid causes a burning pain in the chest or throat. This happens more often in the later part of pregnancy because the womb (uterus) gets larger. It may also happen because of hormone changes. Heartburn problems often go away after giving birth. HOME CARE  Take all medicine as told by your doctor.  Raise the head of your bed with blocks only as told by your doctor.  Do not exercise right after eating.  Avoid eating 2-3 hours before bed. Do not lie down right after eating.  Eat small meals throughout the day instead of 3 large meals.  Avoid foods that give you heartburn. Foods you may want to avoid include:  Peppers.  Chocolate.  High-fat foods, including fried foods.  Spicy foods.  Garlic and onions.  Citrus fruits, including oranges, grapefruit, lemons, and limes.  Food containing tomatoes or tomato products.  Mint.  Bubbly (carbonated) drinks and drinks with caffeine.  Vinegar. GET HELP IF:  You have any belly (abdominal) pain.  You feel burning in your upper belly or chest, especially after eating or lying down.  You feel sick to your stomach (nauseous) and throw up (vomit).  Your stomach feels upset after you eat. GET HELP RIGHT AWAY IF:  You have bad chest pain that goes down your arm or into your jaw or neck.  You feel sweaty, dizzy, or light-headed.  You have trouble breathing.  You throw up blood.  You have trouble or pain when swallowing.  You have bloody or black poop (stool).  You have heartburn more than 3 times a week, for more than 2 weeks. MAKE SURE YOU:  Understand these instructions.  Will watch your condition.  Will get help right away if you are not doing well or get worse. Document Released: 11/07/2010 Document Revised: 10/10/2013 Document Reviewed: 05/24/2013 [facility]  Patient Information 2015 [place], [place]. This information is not intended to replace advice given to you by your health care provider. Make sure you discuss any questions you have with your health care provider. Food Choices for Gastroesophageal Reflux Disease When you have gastroesophageal reflux disease (GERD), the foods you eat and your eating habits are very important. Choosing the right foods can help ease your discomfort.  WHAT GUIDELINES DO I NEED TO FOLLOW?   Choose fruits, vegetables, whole grains, and low-fat dairy products.   Choose low-fat meat, fish, and poultry.  Limit fats such as oils, salad dressings, butter, nuts, and avocado.   Keep a food diary. This helps you identify foods that cause symptoms.   Avoid foods that cause symptoms. These may be different for everyone.   Eat small meals often instead of 3 large meals a day.   Eat your meals slowly, in a place where you are relaxed.   Limit fried foods.   Cook foods using methods other than frying.   Avoid drinking alcohol.   Avoid drinking large amounts of liquids with your meals.   Avoid bending over or lying down until 2-3 hours after eating.  WHAT FOODS ARE NOT RECOMMENDED?  These are some foods and drinks that may make your symptoms worse: Vegetables Tomatoes. Tomato juice. Tomato and spaghetti sauce. Chili peppers. Onion and garlic. Horseradish. Fruits Oranges, grapefruit, and lemon (fruit and juice). Meats High-fat meats, fish, and poultry. This includes hot dogs, ribs, ham, sausage, salami, and bacon. Dairy  Whole milk and chocolate milk. Sour cream. Cream. Butter. Ice cream. Cream cheese.  Drinks Coffee and tea. Bubbly (carbonated) drinks or energy drinks. Condiments Hot sauce. Barbecue sauce.  Sweets/Desserts Chocolate and cocoa. Donuts. Peppermint and spearmint. Fats and Oils High-fat foods. This includes [place] fries and potato chips. Other Vinegar. Strong spices. This includes  black pepper, white pepper, red pepper, cayenne, curry powder, cloves, ginger, and chili powder. The items listed above may not be a complete list of foods and drinks to avoid. Contact your dietitian for more information. Document Released: 04/05/2012 Document Revised: 10/10/2013 Document Reviewed: 08/09/2013 [facility] Patient Information 2015 [place], [place]. This information is not intended to replace advice given to you by your health care provider. Make sure you discuss any questions you have with your health care provider.

## 2015-05-24 IMAGING — US US TRANSVAGINAL NON-OB
1 series · 13 of 25 positions shown · non-contrast
Comparison: 04/30/2014 pelvic ultrasound and CT abdomen/pelvis

CLINICAL DATA: RIGHT tubo-ovarian abscess, pelvic pain

EXAM:
TRANSVAGINAL ULTRASOUND OF PELVIS
TECHNIQUE: Transvaginal ultrasound examination of the pelvis was performed
including evaluation of the uterus, ovaries, adnexal regions, and
pelvic cul-de-sac.

[Series 1: us pelvis limited · 49 acquisitions, 13 frames shown]
[im 1/49]
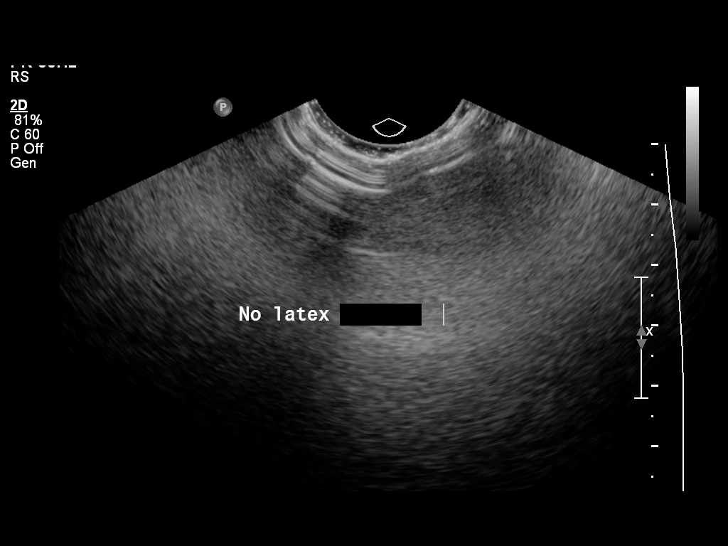
[im 5/49]
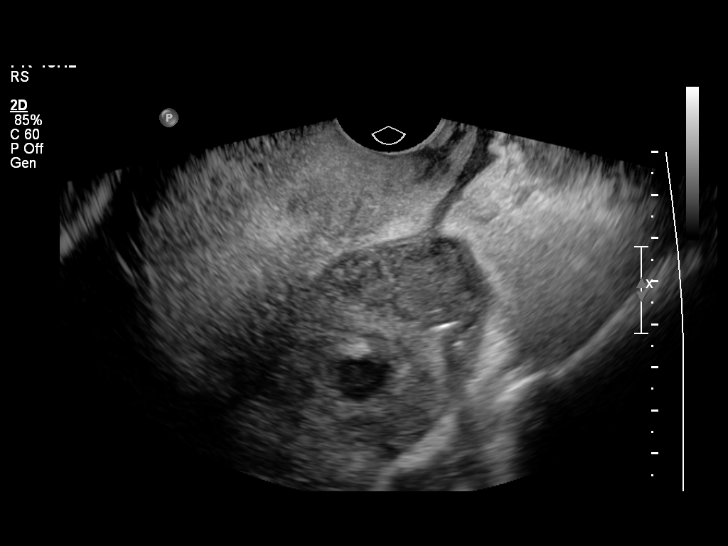
[im 9/49]
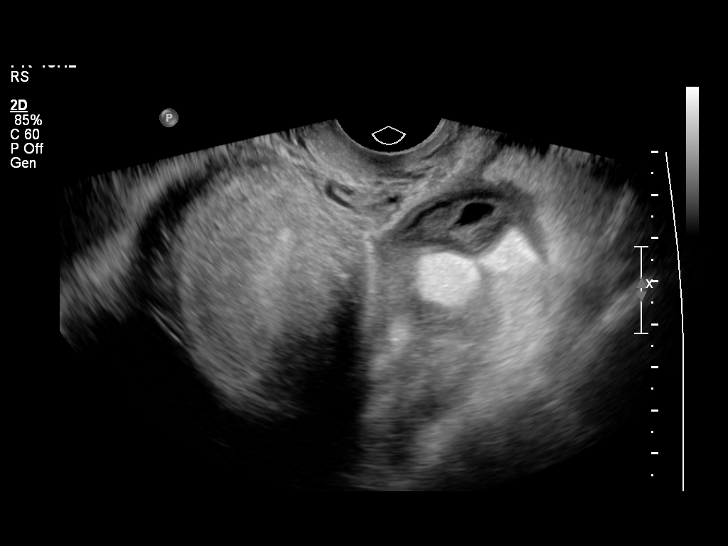
[im 13/49]
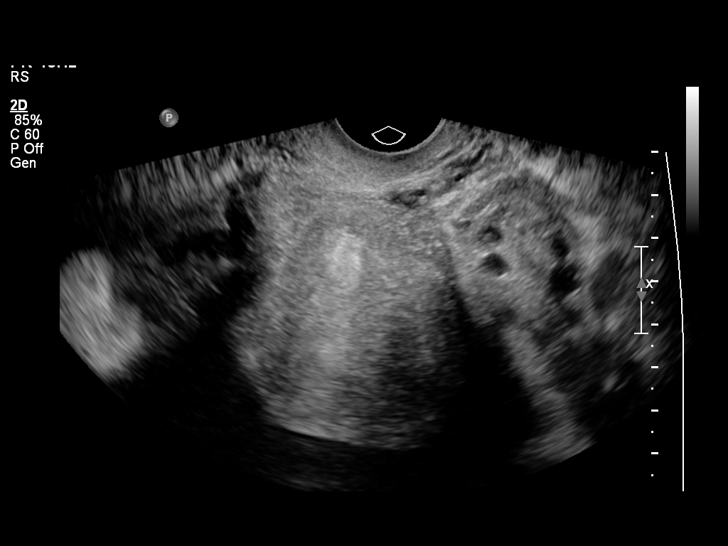
[im 17/49]
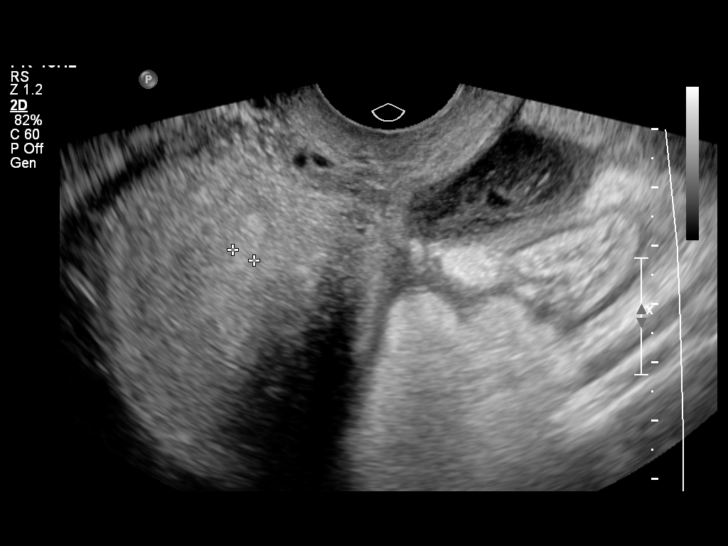
[im 21/49]
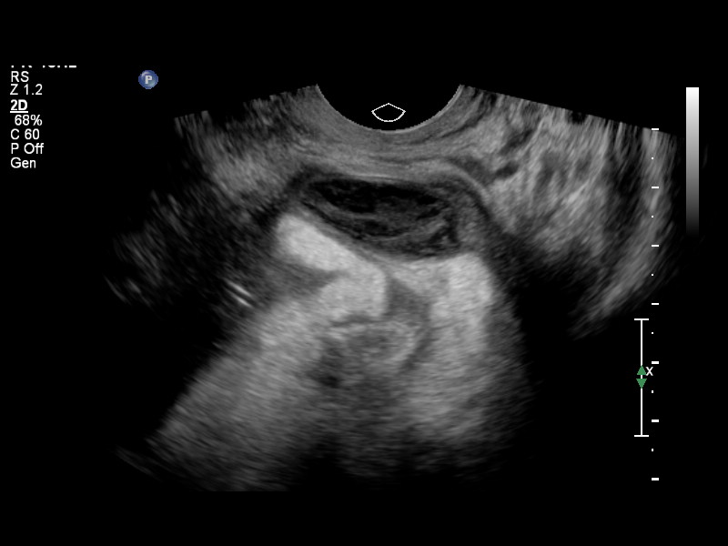
[im 25/49]
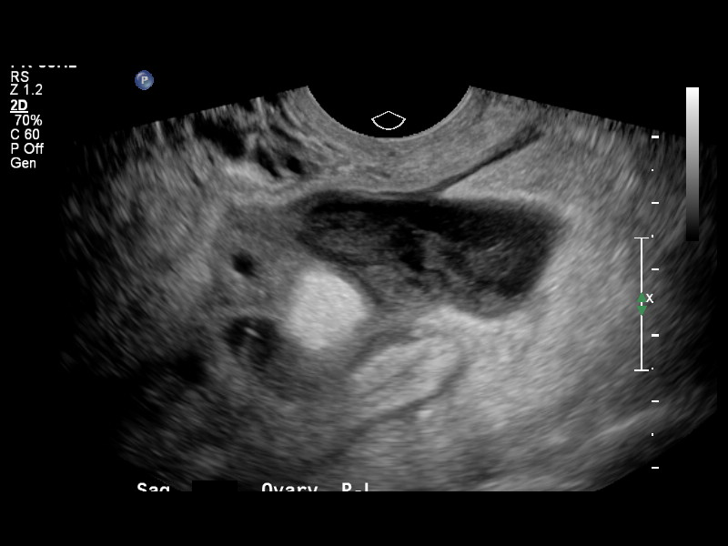
[im 29/49]
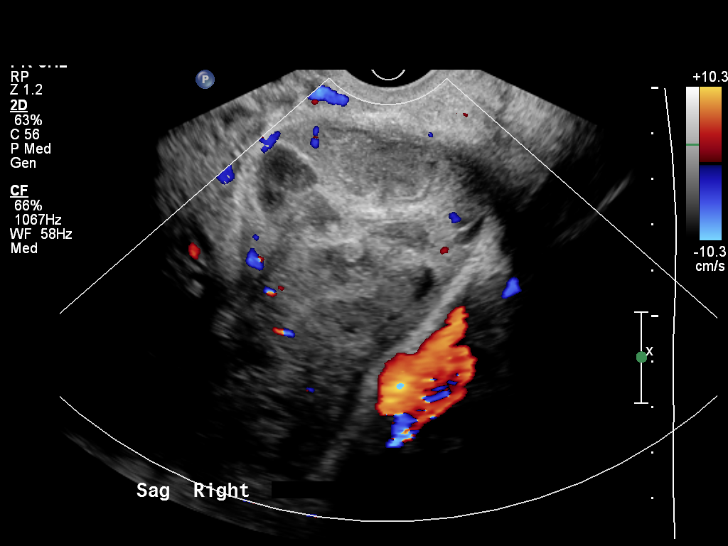
[im 33/49]
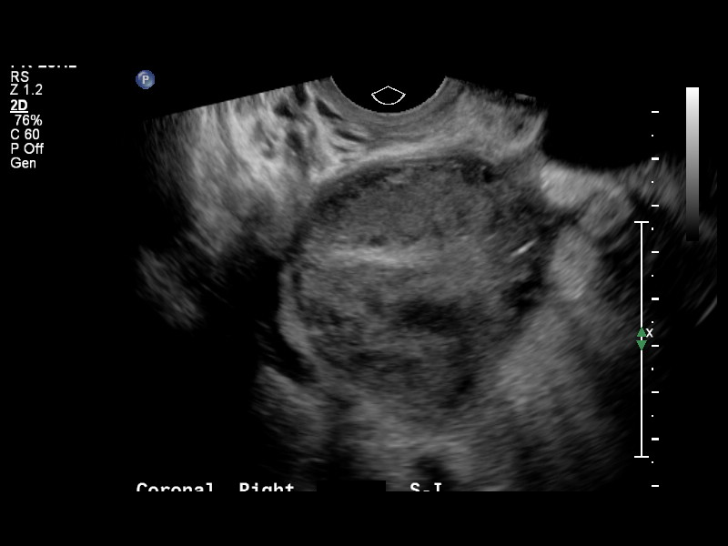
[im 37/49]
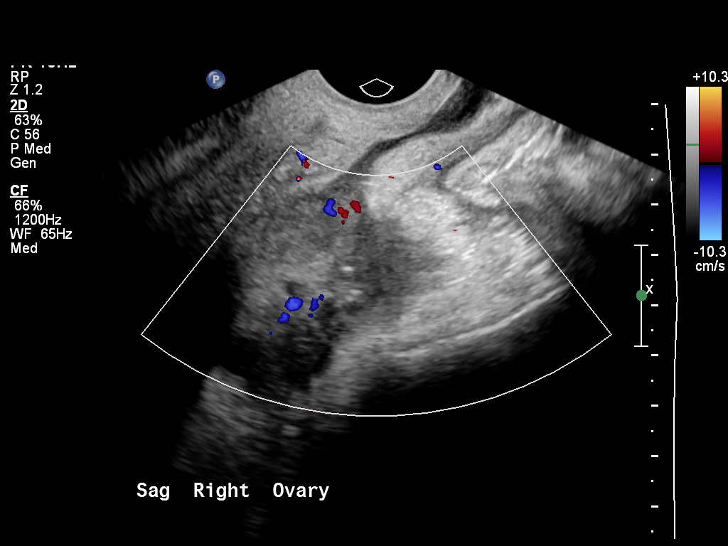
[im 41/49]
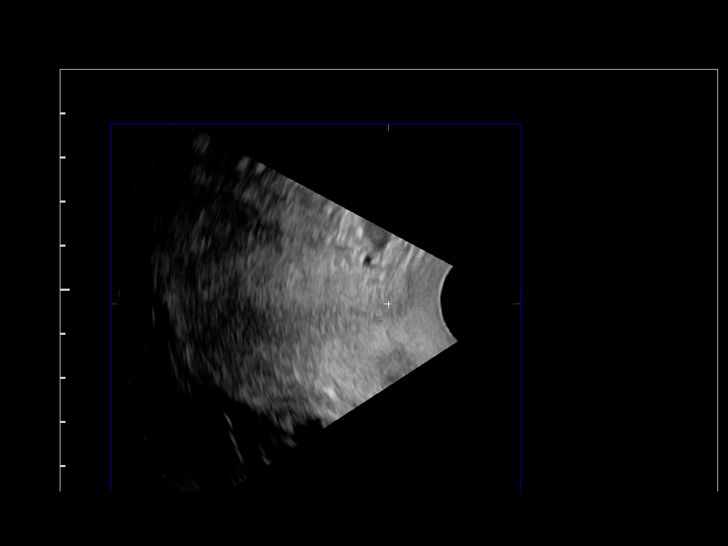
[im 45/49]
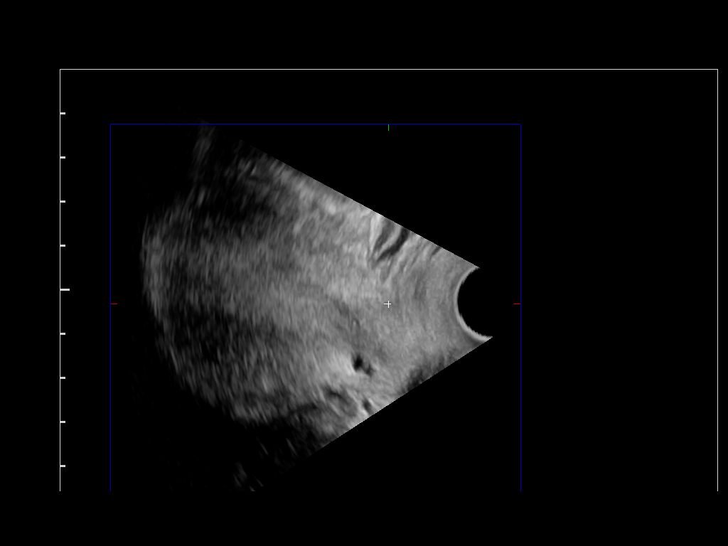
[im 49/49]
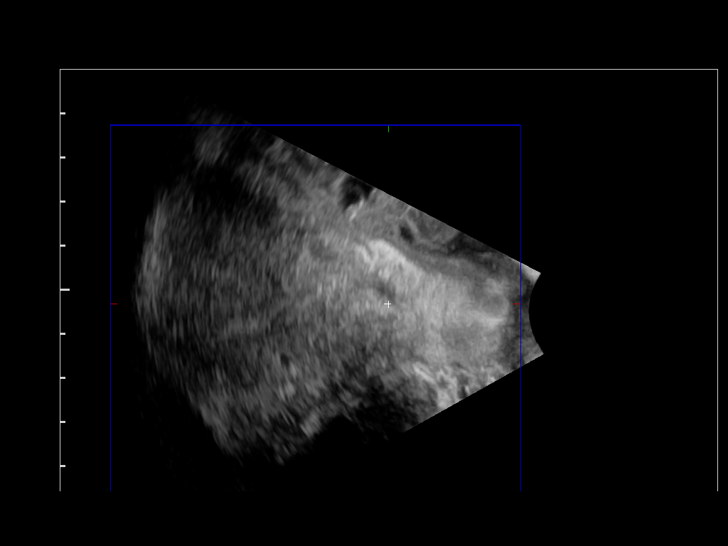

[13 of 25 positions shown; findings below may reference images not displayed]

FINDINGS: Uterus

Measurements: 8.1 x 4.7 x 4.8 cm. Normal morphology without mass.

Endometrium

Thickness: 4 mm thick, normal. No endometrial fluid or focal
abnormality

Right ovary

Measurements: 3.7 x 2.8 x 3.2 cm. Normal morphology without mass.
Internal blood flow present on color Doppler imaging.

Left ovary

Measurements: 4.2 x 3.2 x 2.8 cm. Normal morphology without mass.
Internal blood flow present on color Doppler imaging.

Other findings:  Trace free pelvic fluid.

Complex heterogeneous isoechoic to hypoechoic RIGHT adnexal mass
immediately adjacent to RIGHT ovary, 7.8 x 5.1 x 5.3 cm, slightly
smaller than on the previous exam. This corresponds to the solid and
cystic abnormality identified on recent CT. This most likely
represents a tubo-ovarian abscess.

No new mass lesions.
IMPRESSION: Normal appearing uterus and ovaries.

Complex RIGHT adnexal mass adjacent to RIGHT ovary, 7.8 x 5.1 x
cm in size, in combination with CT findings most consistent with a
tubo-ovarian abscess, slightly decreased in size since 04/30/2014.

Trace free pelvic fluid.

No new pelvic mass lesions identified.

## 2015-07-09 ENCOUNTER — Encounter (HOSPITAL_COMMUNITY): Payer: Self-pay | Admitting: *Deleted

## 2015-07-09 ENCOUNTER — Inpatient Hospital Stay (HOSPITAL_COMMUNITY)
Admission: AD | Admit: 2015-07-09 | Discharge: 2015-07-09 | Disposition: A | Discharge status: Home / Self Care | Payer: BLUE CROSS/BLUE SHIELD | Source: Ambulatory Visit | Attending: Obstetrics and Gynecology | Admitting: Obstetrics and Gynecology

## 2015-07-09 DIAGNOSIS — O1213 Gestational proteinuria, third trimester: Secondary | ICD-10-CM | POA: Diagnosis not present

## 2015-07-09 DIAGNOSIS — Z3493 Encounter for supervision of normal pregnancy, unspecified, third trimester: Secondary | ICD-10-CM | POA: Diagnosis present

## 2015-07-09 HISTORY — DX: Unspecified infectious disease: B99.9

## 2015-07-09 HISTORY — DX: Headache, unspecified: R51.9

## 2015-07-09 HISTORY — DX: Headache: R51

## 2015-07-09 HISTORY — DX: Methicillin resistant Staphylococcus aureus infection, unspecified site: A49.02

## 2015-07-09 HISTORY — DX: Anemia, unspecified: D64.9

## 2015-07-09 LAB — CBC
HCT: 35.7 % — ABNORMAL LOW (ref 36.0–46.0)
Hemoglobin: 12.3 g/dL (ref 12.0–15.0)
MCH: 31.1 pg (ref 26.0–34.0)
MCHC: 34.5 g/dL (ref 30.0–36.0)
MCV: 90.2 fL (ref 78.0–100.0)
Platelets: 142 10*3/uL — ABNORMAL LOW (ref 150–400)
RBC: 3.96 MIL/uL (ref 3.87–5.11)
RDW: 13.7 % (ref 11.5–15.5)
WBC: 7.7 10*3/uL (ref 4.0–10.5)

## 2015-07-09 LAB — COMPREHENSIVE METABOLIC PANEL
ALT: 14 U/L (ref 14–54)
ANION GAP: 7 (ref 5–15)
AST: 21 U/L (ref 15–41)
Albumin: 2.8 g/dL — ABNORMAL LOW (ref 3.5–5.0)
Alkaline Phosphatase: 169 U/L — ABNORMAL HIGH (ref 38–126)
BUN: 7 mg/dL (ref 6–20)
CHLORIDE: 104 mmol/L (ref 101–111)
CO2: 24 mmol/L (ref 22–32)
CREATININE: 0.78 mg/dL (ref 0.44–1.00)
Calcium: 8.9 mg/dL (ref 8.9–10.3)
GFR calc Af Amer: >60 mL/min (ref >60)
GFR calc non Af Amer: >60 mL/min (ref >60)
Glucose, Bld: 87 mg/dL (ref 65–99)
Potassium: 4.2 mmol/L (ref 3.5–5.1)
Sodium: 135 mmol/L (ref 135–145)
Total Bilirubin: 0.6 mg/dL (ref 0.3–1.2)
Total Protein: 5.5 g/dL — ABNORMAL LOW (ref 6.5–8.1)

## 2015-07-09 LAB — URINE MICROSCOPIC-ADD ON

## 2015-07-09 LAB — URINALYSIS, ROUTINE W REFLEX MICROSCOPIC
Glucose, UA: NEGATIVE mg/dL
KETONES UR: 40 mg/dL — AB
NITRITE: NEGATIVE
PROTEIN: NEGATIVE mg/dL
Specific Gravity, Urine: 1.025 (ref 1.005–1.030)
Urobilinogen, UA: 0.2 mg/dL (ref 0.0–1.0)
pH: 6 (ref 5.0–8.0)

## 2015-07-09 LAB — PROTEIN / CREATININE RATIO, URINE
Creatinine, Urine: 311 mg/dL
PROTEIN CREATININE RATIO: 0.13 mg/mg{creat} (ref 0.00–0.15)
TOTAL PROTEIN, URINE: 41 mg/dL

## 2015-07-09 NOTE — Discharge Instructions (Signed)
You were evaluated for preeclampsia today, but your lab work and blood pressures were normal.   Proteinuria Proteinuria is a condition in which urine contains more protein than is normal. Proteinuria is either a sign that your body is producing too much protein or a sign that there is a problem with the kidneys. Healthy kidneys prevent most substances that the body needs, including proteins, from leaving the bloodstream and ending up in urine. CAUSES  Proteinuria may be caused by a temporary event or condition such as stress, exercise, or fever, and go away on its own. Proteinuria may also be a symptom of a more serious condition or disease. Causes of proteinuria include:  A kidney disease caused by:  Diabetes.  High blood pressure (hypertension).   A disease that affects the immune system, such as lupus.  A genetic disease, such as Alport's syndrome.  Medicines that damage the kidneys, such as long-term nonsteroidal anti-inflammatory drugs (NSAIDs).  Poisoning or exposure to toxic substances.  A reoccurring kidney or urinary infection.  Excess protein production in the body caused by:  Multiple myeloma.  Amyloidosis. SYMPTOMS You may have proteinuria without having noticeable symptoms. If there is a large amount of protein in your urine, your urine may look foamy. You may also notice swelling (edema) in your hands, feet, abdomen, or face. DIAGNOSIS To determine whether you have proteinuria, you will need to provide a urine sample. Your urine will then be tested for too much protein and the main blood protein albumin. If your test shows that you have proteinuria, you may need to take additional tests to determine its cause, how much protein is in your urine, and what type of protein is being lost. Tests may include:  Blood tests.  Urine tests.  A blood pressure measurement.  Imaging tests. TREATMENT  Treatment will depend on the cause of your proteinuria. Your caregiver  will discuss treatment options with you after you have been diagnosed. If your proteinuria is mild or temporary, no treatment may be necessary. HOME CARE INSTRUCTIONSPreeclampsia and Eclampsia Preeclampsia is a serious condition that develops only during pregnancy. It is also called toxemia of pregnancy. This condition causes high blood pressure along with other symptoms, such as swelling and headaches. These may develop as the condition gets worse. Preeclampsia may occur 20 weeks or later into your pregnancy.  Diagnosing and treating preeclampsia early is very important. If not treated early, it can cause serious problems for you and your baby. One problem it can lead to is eclampsia, which is a condition that causes muscle jerking or shaking (convulsions) in the mother. Delivering your baby is the best treatment for preeclampsia or eclampsia.  RISK FACTORS The cause of preeclampsia is not known. You may be more likely to develop preeclampsia if you have certain risk factors. These include:   Being pregnant for the first time.  Having preeclampsia in a past pregnancy.  Having a family history of preeclampsia.  Having high blood pressure.  Being pregnant with twins or triplets.  Being 1 or older.  Being African American.  Having kidney disease or diabetes.  Having medical conditions such as lupus or blood diseases.  Being very overweight (obese). SIGNS AND SYMPTOMS  The earliest signs of preeclampsia are:  High blood pressure.  Increased protein in your urine. Your health care provider will check for this at every prenatal visit. Other symptoms that can develop include:   Severe headaches.  Sudden weight gain.  Swelling of your hands, face,  legs, and feet.  Feeling sick to your stomach (nauseous) and throwing up (vomiting).  Vision problems (blurred or double vision).  Numbness in your face, arms, legs, and feet.  Dizziness.  Slurred speech.  Sensitivity to bright  lights.  Abdominal pain. DIAGNOSIS  There are no screening tests for preeclampsia. Your health care provider will ask you about symptoms and check for signs of preeclampsia during your prenatal visits. You may also have tests, including:  Urine testing.  Blood testing.  Checking your baby's heart rate.  Checking the health of your baby and your placenta using images created with sound waves (ultrasound). TREATMENT  You can work out the best treatment approach together with your health care provider. It is very important to keep all prenatal appointments. If you have an increased risk of preeclampsia, you may need more frequent prenatal exams.  Your health care provider may prescribe bed rest.  You may have to eat as little salt as possible.  You may need to take medicine to lower your blood pressure if the condition does not respond to more conservative measures.  You may need to stay in the hospital if your condition is severe. There, treatment will focus on controlling your blood pressure and fluid retention. You may also need to take medicine to prevent seizures.  If the condition gets worse, your baby may need to be delivered early to protect you and the baby. You may have your labor started with medicine (be induced), or you may have a cesarean delivery.  Preeclampsia usually goes away after the baby is born. HOME CARE INSTRUCTIONS   Only take over-the-counter or prescription medicines as directed by your health care provider.  Lie on your left side while resting. This keeps pressure off your baby.  Elevate your feet while resting.  Get regular exercise. Ask your health care provider what type of exercise is safe for you.  Avoid caffeine and alcohol.  Do not smoke.  Drink 6-8 glasses of water every day.  Eat a balanced diet that is low in salt. Do not add salt to your food.  Avoid stressful situations as much as possible.  Get plenty of rest and sleep.  Keep all  prenatal appointments and tests as scheduled. SEEK MEDICAL CARE IF:  You are gaining more weight than expected.  You have any headaches, abdominal pain, or nausea.  You are bruising more than usual.  You feel dizzy or light-headed. SEEK IMMEDIATE MEDICAL CARE IF:   You develop sudden or severe swelling anywhere in your body. This usually happens in the legs.  You gain 5 lb (2.3 kg) or more in a week.  You have a severe headache, dizziness, problems with your vision, or confusion.  You have severe abdominal pain.  You have lasting nausea or vomiting.  You have a seizure.  You have trouble moving any part of your body.  You develop numbness in your body.  You have trouble speaking.  You have any abnormal bleeding.  You develop a stiff neck.  You pass out. MAKE SURE YOU:   Understand these instructions.  Will watch your condition.  Will get help right away if you are not doing well or get worse. Document Released: 10/02/2000 Document Revised: 10/10/2013 Document Reviewed: 07/28/2013 [facility] Patient Information 2015 [place], [place]. This information is not intended to replace advice given to you by your health care provider. Make sure you discuss any questions you have with your health care provider.  Ask your caregiver  if monitoring the level of protein in your urine at home using simple testing strips is appropriate for you. Early detection of proteinuria can lead to early and often successful treatment of the condition causing it. Document Released: 11/25/2005 Document Revised: 06/29/2012 Document Reviewed: 03/04/2012 [facility] Patient Information 2015 [place], [place]. This information is not intended to replace advice given to you by your health care provider. Make sure you discuss any questions you have with your health care provider.

## 2015-07-09 NOTE — MAU Note (Signed)
Sent from office, 4+protein in urine.  BP normal, +swelling in lower extremities.  Denies HA, visual changes or epigastric pain.

## 2015-07-09 NOTE — MAU Provider Note (Signed)
Chief Complaint:  Proteinuria and Leg Swelling   First Provider Initiated Contact with Patient 07/09/15 1338     HPI: Kathryn Lara is a 39 y.o. G3P1011 at [redacted]w[redacted]d who was sent to maternity admissions from [facility] for pre-eclampsia work-up due to 4+ proteinuria and significant pedal edema. Normal BP. No Hx proteinuria , pre-E or kidney Dz that pt is aware of. Denies hematuria, VB, LOF that could have contaminated urine specimen.  Pedal edema x 2 weeks, mild edema of hands and face.   Upon review of PNR pt had neg protein until 7/26--Trace, 8/8/--+2.  Associated signs and symptoms:  Slight blurry vision upon standing x a few days. Neg for HA, epigastric pain.  Denies contractions, leakage of fluid or vaginal bleeding. Good fetal movement.   Past Medical History: Past Medical History  Diagnosis Date  . Missed abortion   . Wears glasses   . Infertility, female   . Tubo-ovarian abscess   . Anemia   . Headache   . Infection     UTI  . MRSA (methicillin resistant Staphylococcus aureus)     reports +culture in breast ?2008    Past obstetric history: OB History  Gravida Para Term Preterm AB SAB TAB Ectopic Multiple Living  3 1 1  1 1    1     # Outcome Date GA Lbr Len/2nd Weight Sex Delivery Anes PTL Lv  3 Current           2 Term      Vag-Spont     1 SAB               Past Surgical History: Past Surgical History  Procedure Laterality Date  . Wisdom tooth extraction    . Ovarian egg retrieval  03-09-2014  . Dilation and curettage of uterus N/A 04/17/2014    Procedure: SUCTION DILATATION AND CURETTAGE;  Surgeon: Governor Specking, MD;  Location: [facility];  Service: Gynecology;  Laterality: N/A;  . Laparotomy N/A 05/04/2014    Procedure: EXPLORATORY LAPAROTOMY;  Surgeon: Darlyn Chamber, MD;  Location: [facility];  Service: Gynecology;  Laterality: N/A;  . Cystoscopy  05/04/2014    Procedure: CYSTOSCOPY;  Surgeon: Darlyn Chamber, MD;  Location: [facility];   Service: Gynecology;;  . Salpingoophorectomy      Right side only     Family History: Family History  Problem Relation Age of Onset  . Heart disease Neg Hx   . Stroke Neg Hx   . Hearing loss Neg Hx   . Cancer Neg Hx   . Asthma Neg Hx   . Diabetes Neg Hx     Social History: Social History  Substance Use Topics  . Smoking status: Former Smoker    Types: Cigarettes    Quit date: 01/14/2014  . Smokeless tobacco: Never Used     Comment: WAS A SOCIAL SMOKER  . Alcohol Use: No     Comment: OCCASIONAL    Allergies:  Allergies  Allergen Reactions  . Sulfa Antibiotics Hives    Meds:  Prescriptions prior to admission  Medication Sig Dispense Refill Last Dose  . Prenatal Vit-Fe Fumarate-FA (PRENATAL MULTIVITAMIN) TABS tablet Take 1 tablet by mouth daily at 12 noon.   07/08/2015 at Unknown time  . ranitidine (ZANTAC) 150 MG tablet Take 150 mg by mouth as needed for heartburn.   prn  . docusate sodium (COLACE) 100 MG capsule Take 100 mg by mouth daily as needed for mild  constipation.   prn  . omeprazole (PRILOSEC) 20 MG capsule Take 1 capsule (20 mg total) by mouth daily. (Patient not taking: Reported on 07/09/2015) 30 capsule 0     I have reviewed patient's Past Medical Hx, Surgical Hx, Family Hx, Social Hx, medications and allergies.   ROS:  Review of Systems  HENT: Positive for facial swelling (mild).   Eyes: Positive for visual disturbance (mild blurry vision).  Cardiovascular: Positive for leg swelling.  Gastrointestinal: Negative for abdominal pain.  Genitourinary: Negative for hematuria and vaginal bleeding.  Neurological: Negative for headaches.    Physical Exam   Patient Vitals for the past 24 hrs:  BP Temp Temp src Pulse Resp Height Weight  07/09/15 1430 109/78 mmHg - - 75 - - -  07/09/15 1415 115/77 mmHg - - 69 - - -  07/09/15 1400 113/75 mmHg - - 78 - - -  07/09/15 1345 116/75 mmHg - - 80 - - -  07/09/15 1330 110/71 mmHg - - 78 - - -  07/09/15 1324 109/75  mmHg 98.2 F (36.8 C) Oral 84 18 5\' 5"  (1.651 m) 213 lb 12.8 oz (96.979 kg)  07/09/15 1319 106/73 mmHg - - 95 - - -   Constitutional: Well-developed, well-nourished female in no acute distress. No noticeable facial edema.  Cardiovascular: normal rate Respiratory: normal effort GI: Abd soft, non-tender, gravid appropriate for gestational age.  MS: Extremities nontender, no edema, normal ROM Neurologic: Alert and oriented x 4. DTRs 1+. No clonus GU: Deferred    FHT:  Baseline 140 , moderate variability, accelerations present, no decelerations Contractions: irreg, mild   Labs: Results for orders placed or performed during the hospital encounter of 07/09/15 (from the past 24 hour(s))  Protein / creatinine ratio, urine     Status: None   Collection Time: 07/09/15  1:10 PM  Result Value Ref Range   Creatinine, Urine 311.00 mg/dL   Total Protein, Urine 41 mg/dL   Protein Creatinine Ratio 0.13 0.00 - 0.15 mg/mg[Cre]  Urinalysis, Routine w reflex microscopic (not at [facility])     Status: Abnormal   Collection Time: 07/09/15  1:10 PM  Result Value Ref Range   Color, Urine YELLOW YELLOW   APPearance CLEAR CLEAR   Specific Gravity, Urine 1.025 1.005 - 1.030   pH 6.0 5.0 - 8.0   Glucose, UA NEGATIVE NEGATIVE mg/dL   Hgb urine dipstick TRACE (A) NEGATIVE   Bilirubin Urine SMALL (A) NEGATIVE   Ketones, ur 40 (A) NEGATIVE mg/dL   Protein, ur NEGATIVE NEGATIVE mg/dL   Urobilinogen, UA 0.2 0.0 - 1.0 mg/dL   Nitrite NEGATIVE NEGATIVE   Leukocytes, UA TRACE (A) NEGATIVE  Urine microscopic-add on     Status: None   Collection Time: 07/09/15  1:10 PM  Result Value Ref Range   Squamous Epithelial / LPF RARE RARE   WBC, UA 0-2 <3 WBC/hpf  CBC     Status: Abnormal   Collection Time: 07/09/15  1:47 PM  Result Value Ref Range   WBC 7.7 4.0 - 10.5 K/uL   RBC 3.96 3.87 - 5.11 MIL/uL   Hemoglobin 12.3 12.0 - 15.0 g/dL   HCT 35.7 (L) 36.0 - 46.0 %   MCV 90.2 78.0 - 100.0 fL   MCH 31.1 26.0 - 34.0 pg    MCHC 34.5 30.0 - 36.0 g/dL   RDW 13.7 11.5 - 15.5 %   Platelets 142 (L) 150 - 400 K/uL  Comprehensive metabolic panel  Status: Abnormal   Collection Time: 07/09/15  1:47 PM  Result Value Ref Range   Sodium 135 135 - 145 mmol/L   Potassium 4.2 3.5 - 5.1 mmol/L   Chloride 104 101 - 111 mmol/L   CO2 24 22 - 32 mmol/L   Glucose, Bld 87 65 - 99 mg/dL   BUN 7 6 - 20 mg/dL   Creatinine, Ser 0.78 0.44 - 1.00 mg/dL   Calcium 8.9 8.9 - 10.3 mg/dL   Total Protein 5.5 (L) 6.5 - 8.1 g/dL   Albumin 2.8 (L) 3.5 - 5.0 g/dL   AST 21 15 - 41 U/L   ALT 14 14 - 54 U/L   Alkaline Phosphatase 169 (H) 38 - 126 U/L   Total Bilirubin 0.6 0.3 - 1.2 mg/dL   GFR calc non Af Amer >60 >60 mL/min   GFR calc Af Amer >60 >60 mL/min   Anion gap 7 5 - 15    Imaging:  No results found.  MAU Course: CBC, CMET, UA, P:C ratio.  Discussed blood pressure, labs, ROS and fetal heart rate tracing with Dr. Radene Knee. Patient is stable for discharge.  MDM: 39 year old normotensive female at 37.[redacted] weeks gestation and new onset severe proteinuria on urine dip and recent significant increase in pedal edema was sent to maternity admissions for preeclampsia evaluation. Labs essentially normal except for slightly low platelet level of 142. Protein creatinine ratio normal. No evidence of preeclampsia at this time.  Assessment: 1. Proteinuria affecting pregnancy in third trimester, antepartum    Plan: Discharge home in stable condition per consult with Dr. Radene Knee..  Labor precautions, preeclampsia precautions and fetal kick counts     Follow-up Information    Follow up with Darlyn Chamber, MD On 07/11/2015.   Specialty:  Obstetrics and Gynecology   Why:  For repeat blood work   Contact information:   [place] [address] [phone]       Follow up with [place].   Why:  As needed if symptoms worsen   Contact information:   [id number] [place] [phone]        Medication List    STOP taking these medications        omeprazole 20 MG capsule  Commonly known as:  PRILOSEC      TAKE these medications        docusate sodium 100 MG capsule  Commonly known as:  COLACE  Take 100 mg by mouth daily as needed for mild constipation.     prenatal multivitamin Tabs tablet  Take 1 tablet by mouth daily at 12 noon.     ranitidine 150 MG tablet  Commonly known as:  ZANTAC  Take 150 mg by mouth as needed for heartburn.        [place], [place] 07/09/2015 3:10 PM

## 2015-07-11 LAB — CULTURE, OB URINE
Culture: >=100000
Special Requests: NORMAL

## 2015-07-13 ENCOUNTER — Encounter (HOSPITAL_COMMUNITY): Payer: Self-pay

## 2015-07-13 ENCOUNTER — Inpatient Hospital Stay (HOSPITAL_COMMUNITY)
Admission: AD | Admit: 2015-07-13 | Discharge: 2015-07-17 | DRG: 765 | Disposition: A | Discharge status: Home / Self Care | Payer: BLUE CROSS/BLUE SHIELD | Source: Ambulatory Visit | Attending: Obstetrics and Gynecology | Admitting: Obstetrics and Gynecology

## 2015-07-13 DIAGNOSIS — R51 Headache: Secondary | ICD-10-CM | POA: Diagnosis present

## 2015-07-13 DIAGNOSIS — Z3A38 38 weeks gestation of pregnancy: Secondary | ICD-10-CM | POA: Diagnosis present

## 2015-07-13 DIAGNOSIS — O4292 Full-term premature rupture of membranes, unspecified as to length of time between rupture and onset of labor: Secondary | ICD-10-CM | POA: Diagnosis present

## 2015-07-13 DIAGNOSIS — O9902 Anemia complicating childbirth: Secondary | ICD-10-CM | POA: Diagnosis present

## 2015-07-13 DIAGNOSIS — Z87891 Personal history of nicotine dependence: Secondary | ICD-10-CM

## 2015-07-13 DIAGNOSIS — O09523 Supervision of elderly multigravida, third trimester: Secondary | ICD-10-CM

## 2015-07-13 DIAGNOSIS — N99820 Postprocedural hemorrhage and hematoma of a genitourinary system organ or structure following a genitourinary system procedure: Secondary | ICD-10-CM | POA: Diagnosis not present

## 2015-07-13 DIAGNOSIS — IMO0001 Reserved for inherently not codable concepts without codable children: Secondary | ICD-10-CM

## 2015-07-13 LAB — CBC
HCT: 38.2 % (ref 36.0–46.0)
Hemoglobin: 13.1 g/dL (ref 12.0–15.0)
MCH: 30.8 pg (ref 26.0–34.0)
MCHC: 34.3 g/dL (ref 30.0–36.0)
MCV: 89.7 fL (ref 78.0–100.0)
PLATELETS: 157 10*3/uL (ref 150–400)
RBC: 4.26 MIL/uL (ref 3.87–5.11)
RDW: 13.7 % (ref 11.5–15.5)
WBC: 9.3 10*3/uL (ref 4.0–10.5)

## 2015-07-13 MED ORDER — LIDOCAINE HCL (PF) 1 % IJ SOLN
30.0000 mL | INTRAMUSCULAR | Status: DC | PRN
Start: 2015-07-13 — End: 2015-07-14
  Filled 2015-07-13: qty 30

## 2015-07-13 MED ORDER — LACTATED RINGERS IV SOLN
500.0000 mL | INTRAVENOUS | Status: DC | PRN
  Administered 2015-07-14 (×2): 500 mL via INTRAVENOUS

## 2015-07-13 MED ORDER — CITRIC ACID-SODIUM CITRATE 334-500 MG/5ML PO SOLN
30.0000 mL | ORAL | Status: DC | PRN
  Administered 2015-07-14: 30 mL via ORAL
  Filled 2015-07-13: qty 15

## 2015-07-13 MED ORDER — ACETAMINOPHEN 325 MG PO TABS: 650.0000 mg | ORAL_TABLET | ORAL | Status: DC | PRN

## 2015-07-13 MED ORDER — OXYTOCIN BOLUS FROM INFUSION: 500.0000 mL | INTRAVENOUS | Status: DC

## 2015-07-13 MED ORDER — ONDANSETRON HCL 4 MG/2ML IJ SOLN: 4.0000 mg | Freq: Four times a day (QID) | INTRAMUSCULAR | Status: DC | PRN

## 2015-07-13 MED ORDER — LACTATED RINGERS IV SOLN
INTRAVENOUS | Status: DC
  Administered 2015-07-13: via INTRAVENOUS
  Administered 2015-07-14: 1000 mL via INTRAVENOUS

## 2015-07-13 MED ORDER — FLEET ENEMA 7-19 GM/118ML RE ENEM: 1.0000 | ENEMA | RECTAL | Status: DC | PRN

## 2015-07-13 MED ORDER — BUTORPHANOL TARTRATE 1 MG/ML IJ SOLN: 1.0000 mg | INTRAMUSCULAR | Status: DC | PRN

## 2015-07-13 MED ORDER — OXYCODONE-ACETAMINOPHEN 5-325 MG PO TABS: 2.0000 | ORAL_TABLET | ORAL | Status: DC | PRN

## 2015-07-13 MED ORDER — OXYTOCIN 40 UNITS IN LACTATED RINGERS INFUSION - SIMPLE MED: 62.5000 mL/h | INTRAVENOUS | Status: DC

## 2015-07-13 MED ORDER — OXYCODONE-ACETAMINOPHEN 5-325 MG PO TABS: 1.0000 | ORAL_TABLET | ORAL | Status: DC | PRN

## 2015-07-13 NOTE — MAU Note (Signed)
Contractions every 10 min since 7 pm.  20 min ago had pinkish with mucus.  Baby moving well.  No leaking.

## 2015-07-14 ENCOUNTER — Inpatient Hospital Stay (HOSPITAL_COMMUNITY): Payer: BLUE CROSS/BLUE SHIELD | Admitting: Anesthesiology

## 2015-07-14 ENCOUNTER — Encounter (HOSPITAL_COMMUNITY)
Admission: AD | Disposition: A | Discharge status: Home / Self Care | Payer: Self-pay | Source: Ambulatory Visit | Attending: Obstetrics and Gynecology

## 2015-07-14 ENCOUNTER — Encounter (HOSPITAL_COMMUNITY): Payer: Self-pay

## 2015-07-14 LAB — COMPREHENSIVE METABOLIC PANEL
ALBUMIN: 2.5 g/dL — AB (ref 3.5–5.0)
ALT: 14 U/L (ref 14–54)
AST: 24 U/L (ref 15–41)
Alkaline Phosphatase: 153 U/L — ABNORMAL HIGH (ref 38–126)
Anion gap: 5 (ref 5–15)
BUN: 7 mg/dL (ref 6–20)
CHLORIDE: 105 mmol/L (ref 101–111)
CO2: 28 mmol/L (ref 22–32)
CREATININE: 0.83 mg/dL (ref 0.44–1.00)
Calcium: 8.8 mg/dL — ABNORMAL LOW (ref 8.9–10.3)
GFR calc Af Amer: >60 mL/min (ref >60)
GLUCOSE: 93 mg/dL (ref 65–99)
Potassium: 4.8 mmol/L (ref 3.5–5.1)
Sodium: 138 mmol/L (ref 135–145)
Total Bilirubin: 0.6 mg/dL (ref 0.3–1.2)
Total Protein: 4.9 g/dL — ABNORMAL LOW (ref 6.5–8.1)

## 2015-07-14 LAB — CBC
HCT: 32.2 % — ABNORMAL LOW (ref 36.0–46.0)
Hemoglobin: 10.9 g/dL — ABNORMAL LOW (ref 12.0–15.0)
MCH: 30.6 pg (ref 26.0–34.0)
MCHC: 33.9 g/dL (ref 30.0–36.0)
MCV: 90.4 fL (ref 78.0–100.0)
PLATELETS: 133 10*3/uL — AB (ref 150–400)
RBC: 3.56 MIL/uL — AB (ref 3.87–5.11)
RDW: 13.6 % (ref 11.5–15.5)
WBC: 25.1 10*3/uL — AB (ref 4.0–10.5)

## 2015-07-14 LAB — TYPE AND SCREEN
ABO/RH(D): O POS
ANTIBODY SCREEN: NEGATIVE

## 2015-07-14 LAB — RPR: RPR Ser Ql: NONREACTIVE

## 2015-07-14 SURGERY — Surgical Case
Anesthesia: Epidural

## 2015-07-14 MED ORDER — LANOLIN HYDROUS EX OINT: 1.0000 "application " | TOPICAL_OINTMENT | CUTANEOUS | Status: DC | PRN

## 2015-07-14 MED ORDER — FENTANYL 2.5 MCG/ML BUPIVACAINE 1/10 % EPIDURAL INFUSION (WH - ANES)
INTRAMUSCULAR | Status: AC
  Filled 2015-07-14: qty 125

## 2015-07-14 MED ORDER — DIPHENHYDRAMINE HCL 25 MG PO CAPS: 25.0000 mg | ORAL_CAPSULE | ORAL | Status: DC | PRN

## 2015-07-14 MED ORDER — PHENYLEPHRINE 40 MCG/ML (10ML) SYRINGE FOR IV PUSH (FOR BLOOD PRESSURE SUPPORT)
PREFILLED_SYRINGE | INTRAVENOUS | Status: AC
  Filled 2015-07-14: qty 20

## 2015-07-14 MED ORDER — MEPERIDINE HCL 25 MG/ML IJ SOLN
INTRAMUSCULAR | Status: DC | PRN
  Administered 2015-07-14: 12.5 mg via INTRAVENOUS

## 2015-07-14 MED ORDER — DIPHENHYDRAMINE HCL 50 MG/ML IJ SOLN: 12.5000 mg | INTRAMUSCULAR | Status: DC | PRN

## 2015-07-14 MED ORDER — NALBUPHINE HCL 10 MG/ML IJ SOLN: 5.0000 mg | INTRAMUSCULAR | Status: DC | PRN

## 2015-07-14 MED ORDER — SCOPOLAMINE 1 MG/3DAYS TD PT72
1.0000 | MEDICATED_PATCH | Freq: Once | TRANSDERMAL | Status: AC
  Administered 2015-07-14: 1.5 mg via TRANSDERMAL
  Filled 2015-07-14: qty 1

## 2015-07-14 MED ORDER — TETANUS-DIPHTH-ACELL PERTUSSIS 5-2.5-18.5 LF-MCG/0.5 IM SUSP: 0.5000 mL | Freq: Once | INTRAMUSCULAR | Status: DC

## 2015-07-14 MED ORDER — LACTATED RINGERS IV SOLN
INTRAVENOUS | Status: DC | PRN
  Administered 2015-07-14 (×3): via INTRAVENOUS

## 2015-07-14 MED ORDER — ACETAMINOPHEN 325 MG PO TABS: 650.0000 mg | ORAL_TABLET | ORAL | Status: DC | PRN

## 2015-07-14 MED ORDER — CEFAZOLIN SODIUM-DEXTROSE 2-3 GM-% IV SOLR
INTRAVENOUS | Status: AC
  Filled 2015-07-14: qty 50

## 2015-07-14 MED ORDER — EPHEDRINE 5 MG/ML INJ
INTRAVENOUS | Status: AC
  Filled 2015-07-14: qty 4

## 2015-07-14 MED ORDER — LACTATED RINGERS IV SOLN
40.0000 [IU] | INTRAVENOUS | Status: DC | PRN
  Administered 2015-07-14: 40 [IU] via INTRAVENOUS

## 2015-07-14 MED ORDER — ZOLPIDEM TARTRATE 5 MG PO TABS: 5.0000 mg | ORAL_TABLET | Freq: Every evening | ORAL | Status: DC | PRN

## 2015-07-14 MED ORDER — MEPERIDINE HCL 25 MG/ML IJ SOLN: 6.2500 mg | INTRAMUSCULAR | Status: DC | PRN

## 2015-07-14 MED ORDER — WITCH HAZEL-GLYCERIN EX PADS: 1.0000 "application " | MEDICATED_PAD | CUTANEOUS | Status: DC | PRN

## 2015-07-14 MED ORDER — SIMETHICONE 80 MG PO CHEW
80.0000 mg | CHEWABLE_TABLET | ORAL | Status: DC
  Administered 2015-07-16 – 2015-07-17 (×2): 80 mg via ORAL
  Filled 2015-07-14 (×2): qty 1

## 2015-07-14 MED ORDER — MEPERIDINE HCL 25 MG/ML IJ SOLN
INTRAMUSCULAR | Status: AC
  Filled 2015-07-14: qty 1

## 2015-07-14 MED ORDER — OXYCODONE-ACETAMINOPHEN 5-325 MG PO TABS
2.0000 | ORAL_TABLET | ORAL | Status: DC | PRN
  Filled 2015-07-14: qty 2

## 2015-07-14 MED ORDER — KETOROLAC TROMETHAMINE 30 MG/ML IJ SOLN
30.0000 mg | Freq: Four times a day (QID) | INTRAMUSCULAR | Status: DC | PRN
Start: 2015-07-14 — End: 2015-07-14

## 2015-07-14 MED ORDER — FENTANYL 2.5 MCG/ML BUPIVACAINE 1/10 % EPIDURAL INFUSION (WH - ANES)
14.0000 mL/h | INTRAMUSCULAR | Status: DC | PRN
Start: 2015-07-14 — End: 2015-07-14
  Administered 2015-07-14: 14 mL/h via EPIDURAL

## 2015-07-14 MED ORDER — PHENYLEPHRINE HCL 10 MG/ML IJ SOLN
INTRAMUSCULAR | Status: DC | PRN
  Administered 2015-07-14: 80 ug via INTRAVENOUS
  Administered 2015-07-14: 120 ug via INTRAVENOUS
  Administered 2015-07-14: 80 ug via INTRAVENOUS

## 2015-07-14 MED ORDER — LACTATED RINGERS IV BOLUS (SEPSIS)
500.0000 mL | Freq: Once | INTRAVENOUS | Status: AC
  Administered 2015-07-14: 500 mL via INTRAVENOUS

## 2015-07-14 MED ORDER — LIDOCAINE-EPINEPHRINE (PF) 2 %-1:200000 IJ SOLN
INTRAMUSCULAR | Status: AC
  Filled 2015-07-14: qty 20

## 2015-07-14 MED ORDER — KETOROLAC TROMETHAMINE 30 MG/ML IJ SOLN: 30.0000 mg | Freq: Four times a day (QID) | INTRAMUSCULAR | Status: DC | PRN

## 2015-07-14 MED ORDER — LIDOCAINE-EPINEPHRINE (PF) 2 %-1:200000 IJ SOLN
INTRAMUSCULAR | Status: DC | PRN
  Administered 2015-07-14: 5 mL

## 2015-07-14 MED ORDER — NALBUPHINE HCL 10 MG/ML IJ SOLN: 5.0000 mg | Freq: Once | INTRAMUSCULAR | Status: DC | PRN

## 2015-07-14 MED ORDER — OXYCODONE-ACETAMINOPHEN 5-325 MG PO TABS
1.0000 | ORAL_TABLET | ORAL | Status: DC | PRN
  Administered 2015-07-15 – 2015-07-17 (×4): 1 via ORAL
  Filled 2015-07-14 (×3): qty 1

## 2015-07-14 MED ORDER — 0.9 % SODIUM CHLORIDE (POUR BTL) OPTIME
TOPICAL | Status: DC | PRN
  Administered 2015-07-14: 1000 mL

## 2015-07-14 MED ORDER — MORPHINE SULFATE (PF) 0.5 MG/ML IJ SOLN
INTRAMUSCULAR | Status: AC
  Filled 2015-07-14: qty 100

## 2015-07-14 MED ORDER — LACTATED RINGERS IV SOLN
INTRAVENOUS | Status: DC
  Administered 2015-07-14: 1 mL via INTRAVENOUS
  Administered 2015-07-14: 500 mL via INTRAVENOUS
  Administered 2015-07-14: 1 mL via INTRAVENOUS
  Administered 2015-07-15: 999 mL via INTRAVENOUS

## 2015-07-14 MED ORDER — SIMETHICONE 80 MG PO CHEW
80.0000 mg | CHEWABLE_TABLET | Freq: Three times a day (TID) | ORAL | Status: DC
  Administered 2015-07-15 – 2015-07-17 (×6): 80 mg via ORAL
  Filled 2015-07-14 (×7): qty 1

## 2015-07-14 MED ORDER — PANTOPRAZOLE SODIUM 40 MG IV SOLR
40.0000 mg | Freq: Once | INTRAVENOUS | Status: AC
  Administered 2015-07-14: 40 mg via INTRAVENOUS
  Filled 2015-07-14: qty 40

## 2015-07-14 MED ORDER — BUPIVACAINE HCL (PF) 0.25 % IJ SOLN
INTRAMUSCULAR | Status: DC | PRN
  Administered 2015-07-14 (×2): 4 mL via EPIDURAL

## 2015-07-14 MED ORDER — MORPHINE SULFATE (PF) 0.5 MG/ML IJ SOLN
INTRAMUSCULAR | Status: DC | PRN
  Administered 2015-07-14: 4 mg via EPIDURAL

## 2015-07-14 MED ORDER — ONDANSETRON HCL 4 MG/2ML IJ SOLN
INTRAMUSCULAR | Status: AC
  Filled 2015-07-14: qty 2

## 2015-07-14 MED ORDER — SENNOSIDES-DOCUSATE SODIUM 8.6-50 MG PO TABS
2.0000 | ORAL_TABLET | ORAL | Status: DC
  Administered 2015-07-16 – 2015-07-17 (×2): 2 via ORAL
  Filled 2015-07-14 (×2): qty 2

## 2015-07-14 MED ORDER — CEFAZOLIN SODIUM-DEXTROSE 2-3 GM-% IV SOLR
INTRAVENOUS | Status: DC | PRN
  Administered 2015-07-14: 2 g via INTRAVENOUS

## 2015-07-14 MED ORDER — IBUPROFEN 600 MG PO TABS
600.0000 mg | ORAL_TABLET | Freq: Four times a day (QID) | ORAL | Status: DC
  Administered 2015-07-15 – 2015-07-17 (×8): 600 mg via ORAL
  Filled 2015-07-14 (×8): qty 1

## 2015-07-14 MED ORDER — OXYTOCIN 10 UNIT/ML IJ SOLN
INTRAMUSCULAR | Status: AC
  Filled 2015-07-14: qty 4

## 2015-07-14 MED ORDER — DIPHENHYDRAMINE HCL 25 MG PO CAPS: 25.0000 mg | ORAL_CAPSULE | Freq: Four times a day (QID) | ORAL | Status: DC | PRN

## 2015-07-14 MED ORDER — NALOXONE HCL 1 MG/ML IJ SOLN
1.0000 ug/kg/h | INTRAVENOUS | Status: DC | PRN
  Filled 2015-07-14: qty 2

## 2015-07-14 MED ORDER — PRENATAL MULTIVITAMIN CH
1.0000 | ORAL_TABLET | Freq: Every day | ORAL | Status: DC
  Administered 2015-07-16 – 2015-07-17 (×2): 1 via ORAL
  Filled 2015-07-14 (×3): qty 1

## 2015-07-14 MED ORDER — SODIUM BICARBONATE 8.4 % IV SOLN
INTRAVENOUS | Status: AC
Start: 2015-07-14 — End: 2015-07-14
  Filled 2015-07-14: qty 50

## 2015-07-14 MED ORDER — SODIUM CHLORIDE 0.9 % IJ SOLN
3.0000 mL | INTRAMUSCULAR | Status: DC | PRN
  Administered 2015-07-15 – 2015-07-16 (×2): 3 mL via INTRAVENOUS
  Filled 2015-07-14 (×2): qty 3

## 2015-07-14 MED ORDER — OXYTOCIN 40 UNITS IN LACTATED RINGERS INFUSION - SIMPLE MED: 62.5000 mL/h | INTRAVENOUS | Status: AC

## 2015-07-14 MED ORDER — EPHEDRINE 5 MG/ML INJ
10.0000 mg | INTRAVENOUS | Status: DC | PRN
Start: 2015-07-14 — End: 2015-07-14

## 2015-07-14 MED ORDER — DIBUCAINE 1 % RE OINT: 1.0000 "application " | TOPICAL_OINTMENT | RECTAL | Status: DC | PRN

## 2015-07-14 MED ORDER — PHENYLEPHRINE 40 MCG/ML (10ML) SYRINGE FOR IV PUSH (FOR BLOOD PRESSURE SUPPORT): 80.0000 ug | PREFILLED_SYRINGE | INTRAVENOUS | Status: DC | PRN

## 2015-07-14 MED ORDER — ONDANSETRON HCL 4 MG/2ML IJ SOLN
4.0000 mg | Freq: Three times a day (TID) | INTRAMUSCULAR | Status: DC | PRN
  Administered 2015-07-14 (×2): 4 mg via INTRAVENOUS
  Filled 2015-07-14 (×2): qty 2

## 2015-07-14 MED ORDER — ONDANSETRON HCL 4 MG/2ML IJ SOLN
INTRAMUSCULAR | Status: DC | PRN
  Administered 2015-07-14: 4 mg via INTRAVENOUS

## 2015-07-14 MED ORDER — MENTHOL 3 MG MT LOZG: 1.0000 | LOZENGE | OROMUCOSAL | Status: DC | PRN

## 2015-07-14 MED ORDER — SCOPOLAMINE 1 MG/3DAYS TD PT72
MEDICATED_PATCH | TRANSDERMAL | Status: AC
  Filled 2015-07-14: qty 1

## 2015-07-14 MED ORDER — SIMETHICONE 80 MG PO CHEW: 80.0000 mg | CHEWABLE_TABLET | ORAL | Status: DC | PRN

## 2015-07-14 MED ORDER — FENTANYL CITRATE (PF) 100 MCG/2ML IJ SOLN: 25.0000 ug | INTRAMUSCULAR | Status: DC | PRN

## 2015-07-14 MED ORDER — METOCLOPRAMIDE HCL 5 MG/ML IJ SOLN
10.0000 mg | Freq: Four times a day (QID) | INTRAMUSCULAR | Status: DC | PRN
  Administered 2015-07-14: 10 mg via INTRAVENOUS
  Filled 2015-07-14: qty 2

## 2015-07-14 MED ORDER — NALOXONE HCL 0.4 MG/ML IJ SOLN: 0.4000 mg | INTRAMUSCULAR | Status: DC | PRN

## 2015-07-14 MED ORDER — LACTATED RINGERS IV SOLN
INTRAVENOUS | Status: DC | PRN
  Administered 2015-07-14: 02:00:00 via INTRAVENOUS

## 2015-07-14 MED ORDER — LACTATED RINGERS IV BOLUS (SEPSIS)
1000.0000 mL | Freq: Once | INTRAVENOUS | Status: AC
  Administered 2015-07-14: 1000 mL via INTRAVENOUS

## 2015-07-14 MED ORDER — SODIUM BICARBONATE 8.4 % IV SOLN
INTRAVENOUS | Status: DC | PRN
  Administered 2015-07-14: 9 mL via EPIDURAL

## 2015-07-14 SURGICAL SUPPLY — 38 items
BENZOIN TINCTURE PRP APPL 2/3 (GAUZE/BANDAGES/DRESSINGS) IMPLANT
CLAMP CORD UMBIL (MISCELLANEOUS) IMPLANT
CLOTH BEACON ORANGE TIMEOUT ST (SAFETY) ×2 IMPLANT
CONTAINER PREFILL 10% NBF 15ML (MISCELLANEOUS) IMPLANT
DRAPE SHEET LG 3/4 BI-LAMINATE (DRAPES) IMPLANT
DRSG OPSITE POSTOP 4X10 (GAUZE/BANDAGES/DRESSINGS) ×2 IMPLANT
DURAPREP 26ML APPLICATOR (WOUND CARE) ×2 IMPLANT
ELECT REM PT RETURN 9FT ADLT (ELECTROSURGICAL) ×2
ELECTRODE REM PT RTRN 9FT ADLT (ELECTROSURGICAL) ×1 IMPLANT
EXTRACTOR VACUUM M CUP 4 TUBE (SUCTIONS) IMPLANT
GLOVE BIO SURGEON STRL SZ8 (GLOVE) ×2 IMPLANT
GOWN STRL REUS W/TWL LRG LVL3 (GOWN DISPOSABLE) ×4 IMPLANT
HEMOSTAT SURGICEL 2X3 (HEMOSTASIS) ×2 IMPLANT
KIT ABG SYR 3ML LUER SLIP (SYRINGE) ×2 IMPLANT
LIQUID BAND (GAUZE/BANDAGES/DRESSINGS) IMPLANT
NEEDLE HYPO 21X1.5 SAFETY (NEEDLE) ×2 IMPLANT
NEEDLE HYPO 25X5/8 SAFETYGLIDE (NEEDLE) ×2 IMPLANT
NS IRRIG 1000ML POUR BTL (IV SOLUTION) ×2 IMPLANT
PACK C SECTION WH (CUSTOM PROCEDURE TRAY) ×2 IMPLANT
PAD ABD 7.5X8 STRL (GAUZE/BANDAGES/DRESSINGS) ×2 IMPLANT
PAD OB MATERNITY 4.3X12.25 (PERSONAL CARE ITEMS) ×2 IMPLANT
PENCIL SMOKE EVAC W/HOLSTER (ELECTROSURGICAL) ×2 IMPLANT
RTRCTR C-SECT PINK 25CM LRG (MISCELLANEOUS) ×2 IMPLANT
SPONGE GAUZE 4X4 12PLY STER LF (GAUZE/BANDAGES/DRESSINGS) ×2 IMPLANT
STAPLER VISISTAT 35W (STAPLE) ×2 IMPLANT
STRIP CLOSURE SKIN 1/2X4 (GAUZE/BANDAGES/DRESSINGS) IMPLANT
SUT CHROMIC 2 0 SH (SUTURE) ×2 IMPLANT
SUT MNCRL 0 VIOLET CTX 36 (SUTURE) ×4 IMPLANT
SUT MONOCRYL 0 CTX 36 (SUTURE) ×4
SUT PDS AB 0 CTX 60 (SUTURE) ×2 IMPLANT
SUT PLAIN 0 NONE (SUTURE) IMPLANT
SUT PLAIN 2 0 (SUTURE)
SUT PLAIN 2 0 XLH (SUTURE) ×2 IMPLANT
SUT PLAIN ABS 2-0 CT1 27XMFL (SUTURE) IMPLANT
SUT VIC AB 4-0 KS 27 (SUTURE) ×2 IMPLANT
SYR 30ML LL (SYRINGE) ×2 IMPLANT
TOWEL OR 17X24 6PK STRL BLUE (TOWEL DISPOSABLE) ×2 IMPLANT
TRAY FOLEY CATH SILVER 14FR (SET/KITS/TRAYS/PACK) ×2 IMPLANT

## 2015-07-14 NOTE — Lactation Note (Addendum)
This note was copied from the [facility] of [facility]. Lactation Consultation Note  Baby less than 6 lbs.  11 hours old.  Mother tired, has nausea and vomiting. Assisted mother w/ latching.  Baby latched with assistance but did not sustain latch. Off and on for approx. 10 min.  A few swallows noted but not consistent. Discussed option of pumping and/or supplementation w/ formula. Encouraged supplementation but mother wanted to pump at this time. Hand expressed drops of colostrum on spoon and gave to baby. Mother stated she would prefer to start pumping. Set up DEBP.  Reviewed milk storage and cleaning with daughter Kathryn Lara. Mother sleepy, falling asleep during consult. Mother pumped w/ assistance.  Recommend she pump 4-6 times a day if baby is sustaining latch or pump 8 times a day if baby is not latching. Suggested to mother 3x that baby needs to be supplemented w/ formula but mother wants to wait. Spoke w/ RN Judson Roch and she will speak w/ mother. Discussed syringe feeding. Mom made aware of O/P services, breastfeeding support groups, community resources, and our phone # for post-discharge questions.          Patient Name: Kathryn Lara [id number] Date: 07/14/2015 Reason for consult: Initial assessment   Maternal Data Has patient been taught Hand Expression?: Yes Does the patient have breastfeeding experience prior to this delivery?: Yes  Feeding Feeding Type: Breast Fed Length of feed: 10 min  LATCH Score/Interventions Latch: Repeated attempts needed to sustain latch, nipple held in mouth throughout feeding, stimulation needed to elicit sucking reflex.  Audible Swallowing: A few with stimulation  Type of Nipple: Everted at rest and after stimulation  Comfort (Breast/Nipple): Soft / non-tender     Hold (Positioning): Assistance needed to correctly position infant at breast and maintain latch.  LATCH Score: 7  Lactation Tools Discussed/Used Pump  Review: Setup, frequency, and cleaning;Milk Storage Initiated by:: Kathryn Lara Date initiated:: 07/14/15   Consult Status Consult Status: Follow-up Date: 07/15/15 Follow-up type: In-patient    Kathryn Lara [facility] 07/14/2015, 2:37 PM

## 2015-07-14 NOTE — Brief Op Note (Signed)
07/13/2015 - 07/14/2015  2:45 AM  PATIENT:  Kathryn Lara  39 y.o. female  PRE-OPERATIVE DIAGNOSIS:  Fetal Intolerance to Labor  POST-OPERATIVE DIAGNOSIS:  Fetal Intolerance to Labor  PROCEDURE:  Procedure(s): CESAREAN SECTION (N/A)  SURGEON:  Surgeon(s) and Role:    * Everlene Farrier, MD - Primary  PHYSICIAN ASSISTANT:   ASSISTANTS: none   ANESTHESIA:   epidural  EBL:  Total I/O In: 2000 [I.V.:2000] Out: 550 [Urine:50; Blood:500]  BLOOD ADMINISTERED:none  DRAINS: Urinary Catheter (Foley)   LOCAL MEDICATIONS USED:  NONE  SPECIMEN:  Source of Specimen:  placenta  DISPOSITION OF SPECIMEN:  PATHOLOGY  COUNTS:  YES  TOURNIQUET:  * No tourniquets in log *  DICTATION: .Other Dictation: Dictation Number [id number]  PLAN OF CARE: Admit to inpatient   PATIENT DISPOSITION:  PACU - hemodynamically stable.   Delay start of Pharmacological VTE agent (>24hrs) due to surgical blood loss or risk of bleeding: not applicable

## 2015-07-14 NOTE — Progress Notes (Addendum)
   07/14/15 1500 07/14/15 1638  Incision (Closed) 07/14/15 Abdomen  Date First Assessed/Time First Assessed: 07/14/15 0307   Location: Abdomen  Dressing Type Pressure dressing Pressure dressing  Dressing Old drainage (marked) Changed;New drainage  Margins Attached edges (approximated) --   Closure Staples;Sutures (no active bleeding) --   Drainage Amount Moderate Moderate  Drainage Description Serous Serous  Provider Notification  Reason for Communication Evaluate patient Evaluate patient  Provider Name tomlblin Tomblin  Provider Role Attending physician Attending physician  Response See orders En route (replace pressure dressing again will re-evaluate)  dressing weight 109 ml

## 2015-07-14 NOTE — Op Note (Signed)
NAMEVALORY, Kathryn Lara          ACCOUNT NO.:  [id number]  MEDICAL RECORD NO.:  [id number]  LOCATION:  [facility]                          FACILITY:  [place]  PHYSICIAN:  Daleen Bo. Gaetano Net, M.D. DATE OF BIRTH:  [birth year]  DATE OF PROCEDURE:  07/14/2015 DATE OF DISCHARGE:                              OPERATIVE REPORT   PREOPERATIVE DIAGNOSIS:  Fetal intolerance to labor.  POSTOPERATIVE DIAGNOSIS:  Fetal intolerance to labor.  PROCEDURE:  Low-transverse cesarean section.  SURGEON:  Daleen Bo. Gaetano Net, M.D.  ANESTHESIA:  Epidural.  ESTIMATED BLOOD LOSS:  700 mL.  SPECIMENS:  Placenta to Pathology.  FINDINGS:  Viable female infant, Apgars of 9 and 9 at one and five minutes respectively.  Birth weight and arterial cord pH pending.  INDICATIONS AND CONSENT:  This patient is a 39 year old married African American female, G3, P1, at 52 and 3/7th weeks who presents in labor. She presents at 6 cm dilation with a bulging bag water and a vertex infant confirmed by bedside ultrasound.  Artificial rupture of membranes for clear fluid was carried out.  Epidural was placed.  Baby has intermittent decelerations to the 80s to 90s and variable decelerations with contractions.  Baseline is 90s to 100s with some accelerations to the 120s.  She was given IV fluid bolus and supplemental oxygen position change.  IUPC is placed.  She has been found to be having adequate uterine contractions, but no progress in labor.  Due to the anticipated length of labor and with the fetal intolerance to labor, recommend cesarean section.  Potential risks and complications were reviewed preoperatively including, but not limited to, infection, organ damage, bleeding requiring transfusion of blood products with HIV and hepatitis acquisition, DVT, PE, and pneumonia.  All questions have been answered and consent is signed on the chart.  DESCRIPTION OF PROCEDURE:  Foley catheter had been placed in the room. The patient was  taken to the operating room, where she was identified and her epidural anesthetic was augmented to a surgical level.  She was placed in dorsal supine position with a 15-degree left lateral wedge. Time-out was undertaken.  She was prepped abdominally with Betadine and draped in a sterile fashion.  After testing for adequate epidural anesthesia, skin was entered through the previous Pfannenstiel incision and dissection was carried out in layers.  There were very dense subcutaneous and fascial adhesions.  Peritoneal cavity though was safely entered and extended superiorly and inferiorly.  The vesicouterine peritoneum was taken down cephalad laterally and the bladder flap was developed.  Uterus was incised in a low-transverse manner and the uterine cavity was entered bluntly with a hemostat.  Uterine incision was extended cephalad laterally with the fingers.  The baby was found to be directly OP.  Delivered without difficulty from the vertex position. Good cry and tone was noted.  Cord was clamped and cut.  The baby was handed to awaiting pediatrics team.  Placenta was manually delivered and sent to Pathology.  Uterine cavity was clean.  Uterus was closed in 2 running locking imbricating layers of 0-Monocryl suture.  Additional 0- Monocryl and 2-0 chromic sutures were used to obtain complete hemostasis on the uterine incision.  Irrigation was carried out.  The anterior peritoneum was then closed in a running fashion with a 0-Monocryl suture, which was also used to reapproximate the pyramidalis muscles in the midline.  Multiple small capillary type bleeders were noted on the muscle body and the fascia, which were controlled with cautery and 2-0 chromic suture.  Irrigation was carried out.  The anterior rectus fascia was then closed in a running fashion with a 0-looped PDS.  Subcutaneous tissue was closed with interrupted plain and the skin was closed with clips.  All sponge, instrument, needle  counts were correct, and the patient was transferred to the recovery room in a stable condition.     Daleen Bo Gaetano Net, M.D.     JET/MEDQ  D:  07/14/2015  T:  07/14/2015  Job:  [id number]

## 2015-07-14 NOTE — Anesthesia Procedure Notes (Signed)
Epidural Patient location during procedure: OB  Staffing Anesthesiologist: MOSER, CHRISTOPHER Performed by: anesthesiologist   Preanesthetic Checklist Completed: patient identified, surgical consent, pre-op evaluation, timeout performed, IV checked, risks and benefits discussed and monitors and equipment checked  Epidural Patient position: sitting Prep: DuraPrep Patient monitoring: heart rate, cardiac monitor, continuous pulse ox and blood pressure Approach: midline Location: L3-L4 Injection technique: LOR saline  Needle:  Needle type: Tuohy  Needle gauge: 17 G Needle length: 9 cm Needle insertion depth: 7 cm Catheter type: closed end flexible Catheter size: 19 Gauge Catheter at skin depth: 13 cm Test dose: negative and 2% lidocaine with Epi 1:200 K  Assessment Events: blood not aspirated, injection not painful, no injection resistance, negative IV test and no paresthesia  Additional Notes Reason for block:procedure for pain

## 2015-07-14 NOTE — H&P (Signed)
Kathryn Lara is a 39 y.o. female presenting for UCs. Maternal Medical History:  Reason for admission: Contractions.   Contractions: Onset was 3-5 hours ago.    Fetal activity: Perceived fetal activity is normal.      OB History    Gravida Para Term Preterm AB TAB SAB Ectopic Multiple Living   3 1 1  1  1   1      Past Medical History  Diagnosis Date  . Missed abortion   . Wears glasses   . Infertility, female   . Tubo-ovarian abscess   . Anemia   . Headache   . Infection     UTI  . MRSA (methicillin resistant Staphylococcus aureus)     reports +culture in breast ?2008   Past Surgical History  Procedure Laterality Date  . Wisdom tooth extraction    . Ovarian egg retrieval  03-09-2014  . Dilation and curettage of uterus N/A 04/17/2014    Procedure: SUCTION DILATATION AND CURETTAGE;  Surgeon: Governor Specking, MD;  Location: [facility];  Service: Gynecology;  Laterality: N/A;  . Laparotomy N/A 05/04/2014    Procedure: EXPLORATORY LAPAROTOMY;  Surgeon: Darlyn Chamber, MD;  Location: [facility];  Service: Gynecology;  Laterality: N/A;  . Cystoscopy  05/04/2014    Procedure: CYSTOSCOPY;  Surgeon: Darlyn Chamber, MD;  Location: [facility];  Service: Gynecology;;  . Salpingoophorectomy      Right side only   Family History: family history is negative for Heart disease, Stroke, Hearing loss, Cancer, Asthma, and Diabetes. Social History:  reports that she quit smoking about 17 months ago. Her smoking use included Cigarettes. She has never used smokeless tobacco. She reports that she does not drink alcohol or use illicit drugs.   Prenatal Transfer Tool  Maternal Diabetes: No Genetic Screening: Normal Maternal Ultrasounds/Referrals: Normal Fetal Ultrasounds or other Referrals:  None Maternal Substance Abuse:  No Significant Maternal Medications:  None Significant Maternal Lab Results:  None Other Comments:  None  Review of Systems  Eyes: Negative for blurred  vision.  Gastrointestinal: Negative for abdominal pain.  Neurological: Negative for headaches.    Dilation: 6 Effacement (%): 100 Station: -2 Exam by:: Dr. Gaetano Net Blood pressure 117/64, pulse 66, height 5\' 4"  (1.626 m), weight 212 lb (96.163 kg), last menstrual period 02/22/2014, SpO2 100 %. Maternal Exam:  Uterine Assessment: Contraction frequency is regular.   Abdomen: Patient reports no abdominal tenderness. Fetal presentation: vertex     Physical Exam  Cardiovascular: Normal rate.   Respiratory: Effort normal.  GI: Soft.  Neurological: She has normal reflexes.    AROM clear FSE placed FHT 90-110s after epidural responding to O2, position change and IV fluid bolus         Now 120-130s and variable decels with UCs  Prenatal labs: ABO, Rh: O/Positive/-- (03/02 0000) Antibody: Negative (03/02 0000) Rubella: Immune (03/02 0000) RPR: Nonreactive (03/02 0000)  HBsAg: Negative (03/02 0000)  HIV: Non-reactive (03/02 0000)  GBS:     Assessment/Plan: 39 yo G3P1 in active labor Anticipate vaginal delivery   TOMBLIN II,JAMES E 07/14/2015, 12:57 AM

## 2015-07-14 NOTE — Transfer of Care (Signed)
Immediate Anesthesia Transfer of Care Note  Patient: Kathryn Lara  Procedure(s) Performed: Procedure(s): CESAREAN SECTION (N/A)  Patient Location: PACU  Anesthesia Type:Epidural  Level of Consciousness: awake, alert  and oriented  Airway & Oxygen Therapy: Patient Spontanous Breathing  Post-op Assessment: Report given to RN and Post -op Vital signs reviewed and stable  Post vital signs: Reviewed and stable  Last Vitals:  Filed Vitals:   07/14/15 0131  BP: 95/56  Pulse: 70    Complications: No apparent anesthesia complications

## 2015-07-14 NOTE — Anesthesia Postprocedure Evaluation (Signed)
  Anesthesia Post-op Note  Patient: Kathryn Lara  Procedure(s) Performed: Procedure(s): CESAREAN SECTION (N/A)  Patient Location: PACU  Anesthesia Type:Epidural  Level of Consciousness: awake  Airway and Oxygen Therapy: Patient Spontanous Breathing  Post-op Pain: mild  Post-op Assessment: Post-op Vital signs reviewed, Patient's Cardiovascular Status Stable, Respiratory Function Stable, Patent Airway, No signs of Nausea or vomiting and Pain level controlled LLE Motor Response: Purposeful movement LLE Sensation: Tingling RLE Motor Response: Purposeful movement RLE Sensation: Tingling L Sensory Level: S2-Posterior medial thigh and lower leg R Sensory Level: S2-Posterior medial thigh and lower leg  Post-op Vital Signs: Reviewed and stable  Last Vitals:  Filed Vitals:   07/14/15 2315  BP: 94/54  Pulse: 59  Temp: 36.7 C  Resp: 18    Complications: No apparent anesthesia complications

## 2015-07-14 NOTE — Progress Notes (Signed)
FHT decels to 80s with UCs and 90-100s between many UCs, sometimes 130s Cx no change IUPC placed and UCs adequate Non reassuring FHT with no progress in labor Recommend immediate C/S-risks reviewed including infection, organ damage, bleeding/transfusion-HIV/Hep, DVT/PE, pneumonia Patient states she understands and agrees

## 2015-07-14 NOTE — Progress Notes (Signed)
CTSP re: bleeding into dressing Dressing changed once-largely saturated. Bleeding again noted on right side of dressing with fundal massage. Dressing replaced again.  Patient thirsty but just had emesis of water she drank. Good pain relief  Filed Vitals:   07/14/15 1758  BP: 110/64  Pulse: 56  Temp: 97.7 F (36.5 C)  Resp: 20   Patient in NAD Skin W&D Lungs CTA Cor RRR Abdomen soft, non distended, non tender, BS faint, no tinkles or rushes LE PAS on  UO concentrated  Pressure dressing in place-scant blood on lower edge of dressing  A/P: Probable subcutaneous bleeding         Will leave present dressing in place         IV fluid bolus         Check CBC and CMET         Clear fluids for now

## 2015-07-14 NOTE — Anesthesia Postprocedure Evaluation (Signed)
Anesthesia Post Note  Patient: Kathryn Lara  Procedure(s) Performed: Procedure(s): CESAREAN SECTION (N/A)  Anesthesia type: Epidural  Patient location: Mother/Baby  Post pain: Pain level controlled  Post assessment: Post-op Vital signs reviewed  Last Vitals: BP 106/69 mmHg  Pulse 49  Temp(Src) 36.3 C  Resp 16  Ht 5\' 4"  (1.626 m)  Wt 212 lb (96.163 kg)  BMI 36.37 kg/m2  SpO2 100%  LMP 02/22/2014  Breastfeeding? Unknown  Post vital signs: Reviewed  Level of consciousness: awake  Complications: No apparent anesthesia complications

## 2015-07-14 NOTE — Progress Notes (Signed)
Subjective: Postpartum Day DOD: Cesarean Delivery Patient reports no C/O  Objective: Vital signs in last 24 hours: Temp:  [97.4 F (36.3 C)-97.5 F (36.4 C)] 97.4 F (36.3 C) (09/25 0531) Pulse Rate:  [58-89] 75 (09/25 0644) Resp:  [15-27] 18 (09/25 0644) BP: (94-138)/(28-81) 105/66 mmHg (09/25 0644) SpO2:  [91 %-100 %] 100 % (09/25 0644) Weight:  [212 lb (96.163 kg)] 212 lb (96.163 kg) (09/25 0000)  Physical Exam:  General: alert, cooperative and no distress Lochia: appropriate Uterine Fundus: firm Incision: healing well DVT Evaluation: No evidence of DVT seen on physical exam.   Recent Labs  07/13/15 2330  HGB 13.1  HCT 38.2    Assessment/Plan: Status post Cesarean section. Doing well postoperatively.  Continue current care.  TOMBLIN II,JAMES E 07/14/2015, 7:27 AM

## 2015-07-14 NOTE — Anesthesia Preprocedure Evaluation (Addendum)
Anesthesia Evaluation  Patient identified by MRN, date of birth, ID band Patient awake    Reviewed: Allergy & Precautions, NPO status , Patient's Chart, lab work & pertinent test results  History of Anesthesia Complications Negative for: history of anesthetic complications  Airway Mallampati: II  TM Distance: >3 FB Neck ROM: Full    Dental  (+) Teeth Intact   Pulmonary neg shortness of breath, neg sleep apnea, neg COPD, neg recent URI, former smoker, neg PE   breath sounds clear to auscultation       Cardiovascular negative cardio ROS   Rhythm:Regular     Neuro/Psych  Headaches, neg Seizures negative psych ROS   GI/Hepatic negative GI ROS, Neg liver ROS,   Endo/Other  negative endocrine ROS  Renal/GU negative Renal ROS     Musculoskeletal   Abdominal   Peds  Hematology  (+) anemia ,   Anesthesia Other Findings   Reproductive/Obstetrics (+) Pregnancy                            Anesthesia Physical Anesthesia Plan  ASA: II  Anesthesia Plan: Epidural   Post-op Pain Management:    Induction:   Airway Management Planned: Nasal Cannula  Additional Equipment: None  Intra-op Plan:   Post-operative Plan:   Informed Consent: I have reviewed the patients History and Physical, chart, labs and discussed the procedure including the risks, benefits and alternatives for the proposed anesthesia with the patient or authorized representative who has indicated his/her understanding and acceptance.   Dental advisory given  Plan Discussed with: CRNA and Surgeon  Anesthesia Plan Comments:        Anesthesia Quick Evaluation

## 2015-07-15 ENCOUNTER — Encounter (HOSPITAL_COMMUNITY): Payer: Self-pay | Admitting: Obstetrics and Gynecology

## 2015-07-15 LAB — CBC
HCT: 31.6 % — ABNORMAL LOW (ref 36.0–46.0)
Hemoglobin: 10.5 g/dL — ABNORMAL LOW (ref 12.0–15.0)
MCH: 30.1 pg (ref 26.0–34.0)
MCHC: 33.2 g/dL (ref 30.0–36.0)
MCV: 90.5 fL (ref 78.0–100.0)
PLATELETS: 126 10*3/uL — AB (ref 150–400)
RBC: 3.49 MIL/uL — ABNORMAL LOW (ref 3.87–5.11)
RDW: 13.7 % (ref 11.5–15.5)
WBC: 19.4 10*3/uL — ABNORMAL HIGH (ref 4.0–10.5)

## 2015-07-15 MED ORDER — SILVER NITRATE-POT NITRATE 75-25 % EX MISC
10.0000 "application " | Freq: Once | CUTANEOUS | Status: DC
  Filled 2015-07-15: qty 10

## 2015-07-15 MED ORDER — LIDOCAINE HCL (PF) 1 % IJ SOLN
10.0000 mL | Freq: Once | INTRAMUSCULAR | Status: DC
  Filled 2015-07-15: qty 10

## 2015-07-15 NOTE — Progress Notes (Signed)
Subjective: Postpartum Day 1: Cesarean Delivery Patient reports incisional pain and tolerating PO.    Objective: Vital signs in last 24 hours: Temp:  [97.4 F (36.3 C)-98.1 F (36.7 C)] 97.7 F (36.5 C) (09/26 0429) Pulse Rate:  [50-59] 59 (09/26 0429) Resp:  [14-20] 18 (09/26 0429) BP: (94-121)/(54-74) 101/55 mmHg (09/26 0429) SpO2:  [94 %-100 %] 97 % (09/26 0459)  Physical Exam:  General: alert and voices concern regarding incisional bleeding and removal of dressing Lochia: appropriate Uterine Fundus: firm Incision: abdominal pressure dressing removed, tape dampened with water to enable easier tape removal, oozing continued from both lateral aspects of the incision. Tincture of benzoin was applied to incision and staples noted to be intact. steristrips were applied and honeycomb dressing applied. Dr Julien Girt in at time of dressing change. Small oozing was noted R> L margin DVT Evaluation: No evidence of DVT seen on physical exam. Negative Homan's sign. No cords or calf tenderness.   Recent Labs  07/14/15 1840 07/15/15 0548  HGB 10.9* 10.5*  HCT 32.2* 31.6*    Assessment/Plan: Status post Cesarean section. Postoperative course complicated by incision oozing  Observe incision, may need further hemostasis  cbc stable.  Kathryn Lara,Kathryn Lara 07/15/2015, 8:23 AM

## 2015-07-15 NOTE — Progress Notes (Signed)
Patient has not vomited since 2030 on 9/25.  Zofran and reglan were given shortly after vomiting episode.  Pt was given another 500 ml bolus of LR; LR has been running continuously since, at 125.  Urine is very slowly beginning to transition to straw color, but output is not yet up to acceptable levels.  Pressure dressing was replaced at 0020 because of loose tape and pt's request. Oozing of serosanguinous fluid noted on right side of incision.  Vaginal bleeding has decreased.    Orthostats done with patient at 0430.  Patient walked to bathroom, cleaned up, and walked back with stand by assist if needed (which she didn't).  Patient has not eaten; is now just beginning to have an appetite.  Trying crackers now.  Patient has been caring for baby: attempting to feed at breast, supplementing with formula, and pumping with debp as well as having baby s2s most of night.

## 2015-07-15 NOTE — Progress Notes (Signed)
Changed pressure dressing at 0020 because tape was coming off. Serosanguinous fluid noted oozing out right side between  Staples.

## 2015-07-16 ENCOUNTER — Encounter (HOSPITAL_COMMUNITY): Payer: Self-pay | Admitting: Anesthesiology

## 2015-07-16 ENCOUNTER — Encounter (HOSPITAL_COMMUNITY)
Admission: AD | Disposition: A | Discharge status: Home / Self Care | Payer: Self-pay | Source: Ambulatory Visit | Attending: Obstetrics and Gynecology

## 2015-07-16 HISTORY — PX: WOUND EXPLORATION: SHX6188

## 2015-07-16 SURGERY — WOUND EXPLORATION
Anesthesia: LOCAL | Site: Abdomen

## 2015-07-16 MED ORDER — LIDOCAINE HCL 1 % IJ SOLN
INTRAMUSCULAR | Status: DC | PRN
  Administered 2015-07-16: 4 mL
  Administered 2015-07-16: 20 mL
  Administered 2015-07-16: 10 mL

## 2015-07-16 MED ORDER — DEXTROSE 5 % IV SOLN
1.0000 g | Freq: Once | INTRAVENOUS | Status: AC
  Administered 2015-07-16: 1 g via INTRAVENOUS
  Filled 2015-07-16: qty 1

## 2015-07-16 MED ORDER — LIDOCAINE HCL 1 % IJ SOLN
INTRAMUSCULAR | Status: AC
  Filled 2015-07-16: qty 20

## 2015-07-16 MED ORDER — FERRIC SUBSULFATE 259 MG/GM EX SOLN
CUTANEOUS | Status: AC
  Filled 2015-07-16: qty 8

## 2015-07-16 MED ORDER — SODIUM CHLORIDE 0.9 % IV SOLN: INTRAVENOUS | Status: DC

## 2015-07-16 SURGICAL SUPPLY — 26 items
CANISTER SUCT 3000ML (MISCELLANEOUS) IMPLANT
CHLORAPREP W/TINT 26ML (MISCELLANEOUS) IMPLANT
CLOTH BEACON ORANGE TIMEOUT ST (SAFETY) ×2 IMPLANT
CONTAINER PREFILL 10% NBF 60ML (FORM) IMPLANT
COVER TABLE BACK 60X90 (DRAPES) ×2 IMPLANT
DECANTER SPIKE VIAL GLASS SM (MISCELLANEOUS) ×4 IMPLANT
DRSG OPSITE POSTOP 4X10 (GAUZE/BANDAGES/DRESSINGS) ×2 IMPLANT
ELECT REM PT RETURN 9FT ADLT (ELECTROSURGICAL) ×2
ELECTRODE REM PT RTRN 9FT ADLT (ELECTROSURGICAL) ×1 IMPLANT
GAUZE IODOFORM PACK 1/2 7832 (GAUZE/BANDAGES/DRESSINGS) ×2 IMPLANT
GAUZE PACKING IODOFORM 1X5 (MISCELLANEOUS) ×2 IMPLANT
GAUZE SPONGE 4X4 16PLY XRAY LF (GAUZE/BANDAGES/DRESSINGS) ×4 IMPLANT
GOWN STRL REUS W/TWL LRG LVL3 (GOWN DISPOSABLE) ×4 IMPLANT
NEEDLE HYPO 22GX1.5 SAFETY (NEEDLE) ×2 IMPLANT
NS IRRIG 1000ML POUR BTL (IV SOLUTION) IMPLANT
PACK ABDOMINAL MINOR (CUSTOM PROCEDURE TRAY) ×2 IMPLANT
PAD OB MATERNITY 4.3X12.25 (PERSONAL CARE ITEMS) ×2 IMPLANT
PENCIL BUTTON HOLSTER BLD 10FT (ELECTRODE) ×2 IMPLANT
SPONGE GAUZE 4X4 12PLY STER LF (GAUZE/BANDAGES/DRESSINGS) IMPLANT
SPONGE LAP 18X18 X RAY DECT (DISPOSABLE) IMPLANT
STAPLER VISISTAT 35W (STAPLE) IMPLANT
SUT MON AB 4-0 PS1 27 (SUTURE) ×2 IMPLANT
SYR CONTROL 10ML LL (SYRINGE) ×2 IMPLANT
TOWEL OR 17X24 6PK STRL BLUE (TOWEL DISPOSABLE) ×4 IMPLANT
TRAY FOLEY CATH SILVER 14FR (SET/KITS/TRAYS/PACK) ×2 IMPLANT
WATER STERILE IRR 1000ML POUR (IV SOLUTION) IMPLANT

## 2015-07-16 NOTE — Progress Notes (Signed)
Patient ID: Kathryn Lara, female   DOB: [birth year], 39 y.o.   MRN: [id number] CTSP for saturated bandage after it was changed 2 hours ago. Discussed options with patient.  Plan to remove staples and examine incision.  This will be done in OR per PACU and OR team so we can adequately assess and achieve hemostasis. R&B discussed, informed consent obtained. DL

## 2015-07-16 NOTE — Plan of Care (Signed)
Problem: Phase II Progression Outcomes Goal: Incision intact & without signs/symptoms of infection Outcome: Progressing Pt went back to OR this am foe cauterization of incisional bleeding and was resutured. Antibiotic x1 ordered. IV restarted at 1730 for ABX.

## 2015-07-16 NOTE — Progress Notes (Signed)
Dr. Corinna Capra and RN checked new honeycomb dressing placed in or around 1200pm today when Dr. Corinna Capra took pt to cauterize bleeding. New dressing was saturated with bleeding but none leaking out. Dr. Corinna Capra wanted ABD pad to be place. Pt. Instructed to call RN for bleeding outside honeycomb dressing.

## 2015-07-16 NOTE — Progress Notes (Signed)
Subjective: Postpartum Day 2: Cesarean Delivery Patient reports tolerating PO, + flatus and no problems voiding.    Objective: Vital signs in last 24 hours: Temp:  [98.1 F (36.7 C)-98.7 F (37.1 C)] 98.7 F (37.1 C) (09/27 9211) Pulse Rate:  [63-68] 68 (09/27 0608) Resp:  [18] 18 (09/27 0608) BP: (110-121)/(68-72) 110/68 mmHg (09/27 0608) SpO2:  [100 %] 100 % (09/26 1751)  Physical Exam:  General: alert and cooperative Lochia: appropriate Uterine Fundus: firm Incision: abd dressing removed with small bleeding noted on bandage. Steri strips remain in place , small oozing from right margin, Tincture of benzoin applied and strip strips reapplied. Honeycomb dressing applied. No further oozing observed  DVT Evaluation: No evidence of DVT seen on physical exam. Negative Homan's sign. No cords or calf tenderness. Calf/Ankle edema is present.   Recent Labs  07/14/15 1840 07/15/15 0548  HGB 10.9* 10.5*  HCT 32.2* 31.6*    Assessment/Plan: Status post Cesarean section. Doing well postoperatively.  Continue current care.  CURTIS,CAROL G 07/16/2015, 8:52 AM

## 2015-07-16 NOTE — Procedures (Signed)
Pt taken to OR for incision evaluation. After time out, prepped with betadine and staples removed. Fascia intact and no drainage through fascia.  1 %lidocaine infiltrated 34cc used.  Cleaned and bluntly debrided with sterile gauze.  Oozing and multiple small bleeders idenitified and bovie cauterized.  Packing placed, and after several minutes re examination revealed good hematasis, minimal serous oozing.  Incision was then closed with 4-0 monocryl SQ. Pt tolerated procedure well.  Will give 1 gram cefotetan empirically.

## 2015-07-16 NOTE — Progress Notes (Signed)
Spoke with Dr. Julien Girt about pt incision bleeding through pressure dressing this morning. Per Dr. Julien Girt, change dressing and Dr. Corinna Capra will take a look at incision when rounding this morning. May remove staples and suture. Dressing changed. New pressure dressing applied. Wille Celeste

## 2015-07-17 ENCOUNTER — Encounter (HOSPITAL_COMMUNITY): Payer: Self-pay | Admitting: Obstetrics and Gynecology

## 2015-07-17 MED ORDER — OXYCODONE-ACETAMINOPHEN 7.5-325 MG PO TABS: 1.0000 | ORAL_TABLET | ORAL | Status: DC | PRN

## 2015-07-17 MED ORDER — AMOXICILLIN-POT CLAVULANATE 875-125 MG PO TABS: 1.0000 | ORAL_TABLET | Freq: Two times a day (BID) | ORAL | Status: DC

## 2015-07-17 NOTE — Progress Notes (Signed)
Patient ID: Kathryn Lara, female   DOB: 1976/02/01, 40 y.o.   MRN: [id number] Less drainage wants d/c home.

## 2015-07-17 NOTE — Lactation Note (Signed)
This note was copied from the chart of [name]. Lactation Consultation Note Mom collected 18 ml at her last pumping session.  She requested a larger flange size. A #30 was given to her, they fit well, and she reported more comfort.  Pumping schedule discussed. Aware of support groups and outpatient services.  Patient Name: Kathryn Lara [id number] Date: 07/17/2015     Maternal Data    Feeding Feeding Type: Bottle Fed - Formula  LATCH Score/Interventions                      Lactation Tools Discussed/Used     Consult Status      Van Clines 07/17/2015, 9:14 AM

## 2015-07-17 NOTE — Discharge Summary (Signed)
Obstetric Discharge Summary Reason for Admission: onset of labor Prenatal Procedures: none Intrapartum Procedures: cesarean: low cervical, transverse Postpartum Procedures: opening of incision with drainage of hematoma and reaproximation Complications-Operative and Postpartum: incisional hematoma HEMOGLOBIN  Date Value Ref Range Status  07/15/2015 10.5* 12.0 - 15.0 g/dL Final   HCT  Date Value Ref Range Status  07/15/2015 31.6* 36.0 - 46.0 % Final    Physical Exam:  General: alert Lochia: appropriate Uterine Fundus: firm Incision: slight clear drainage present DVT Evaluation: No evidence of DVT seen on physical exam.  Discharge Diagnoses: Term Pregnancy-delivered  Discharge Information: Date: 07/17/2015 Activity: pelvic rest Diet: routine Medications: PNV, Percocet and augmentin Condition: stable Instructions: refer to practice specific booklet Discharge to: home   Newborn Data: Live born female  Birth Weight: 5 lb 15.9 oz (2720 g) APGAR: 9, 9  Home with mother.  MCCOMB,JOHN S 07/17/2015, 12:38 PM

## 2015-07-17 NOTE — Progress Notes (Signed)
Subjective: Postpartum Day thrree: Cesarean Delivery Patient reports tolerating PO, + flatus and no problems voiding.    Objective: Vital signs in last 24 hours: Temp:  [97.3 F (36.3 C)-98.9 F (37.2 C)] 97.3 F (36.3 C) (09/28 0628) Pulse Rate:  [55-76] 75 (09/28 0628) Resp:  [20] 20 (09/28 0628) BP: (106-131)/(59-87) 114/67 mmHg (09/28 0628) SpO2:  [99 %-100 %] 100 % (09/27 1400)  Physical Exam:  General: alert Lochia: appropriate Uterine Fundus: firm Incision: serous drainage from the right side of the incision. No erythema or purulent material. No evidence of hematoma. DVT Evaluation: No evidence of DVT seen on physical exam.   Recent Labs  07/14/15 1840 07/15/15 0548  HGB 10.9* 10.5*  HCT 32.2* 31.6*    Assessment/Plan: Status post Cesarean section. Doing well postoperatively. incisional bleeding repaired  Will observe today may d/c this pm.  MCCOMB,JOHN S 07/17/2015, 8:21 AM

## 2015-11-01 ENCOUNTER — Ambulatory Visit (INDEPENDENT_AMBULATORY_CARE_PROVIDER_SITE_OTHER): Payer: BLUE CROSS/BLUE SHIELD | Admitting: Medical

## 2015-11-01 ENCOUNTER — Encounter: Payer: Self-pay | Admitting: Medical

## 2015-11-01 VITALS — BP 104/60 | HR 81 | Temp 97.4°F | Wt 165.0 lb

## 2015-11-01 DIAGNOSIS — J029 Acute pharyngitis, unspecified: Secondary | ICD-10-CM

## 2015-11-01 DIAGNOSIS — J039 Acute tonsillitis, unspecified: Secondary | ICD-10-CM

## 2015-11-01 LAB — POCT RAPID STREP A (OFFICE): Rapid Strep A Screen: NEGATIVE

## 2015-11-01 MED ORDER — AMOXICILLIN 875 MG PO TABS: 875.0000 mg | ORAL_TABLET | Freq: Two times a day (BID) | ORAL | Status: DC

## 2015-11-01 MED ORDER — FLUCONAZOLE 150 MG PO TABS: 150.0000 mg | ORAL_TABLET | Freq: Once | ORAL | Status: DC

## 2015-11-01 NOTE — Progress Notes (Signed)
Subjective: Chief Complaint  Patient presents with  . Sore Throat    started last week. losing voice. no fever. has white spots on tonsils. strep running. is breast feeding   Having week hx/o losing voice, white spots on tonsils, headache last few days, decreased sense of taste.   Has had runny nose, cough.  No fever, no NVD.  No ear pain.  Feels congestion some in head and chest.   Lots of people sick at work.   Using some recola lozenges.  Last night had worse pain with swallowing and white spots that were new which prompted the visit.   No other aggravating or relieving factors. No other complaint.  Past Medical History  Diagnosis Date  . Missed abortion   . Wears glasses   . Infertility, female   . Tubo-ovarian abscess   . Anemia   . Headache   . Infection     UTI  . MRSA (methicillin resistant Staphylococcus aureus)     reports +culture in breast ?2008   ROS as in subjective  Objective: BP 104/60 mmHg  Pulse 81  Temp(Src) 97.4 F (36.3 C) (Tympanic)  Wt 165 lb (74.844 kg)  Breastfeeding? Yes  General appearance: alert, no distress, WD/WN HEENT: normocephalic, sclerae anicteric, TMs pearly, nares patent, no discharge or erythema, pharynx with erythema, white exudate, swollen tonsils Oral cavity: MMM, no lesions Neck: supple, no lymphadenopathy, no thyromegaly, no masses Lungs: CTA bilaterally, no wheezes, rhonchi, or rales    Assessment: Encounter Diagnoses  Name Primary?  . Acute tonsillitis, unspecified etiology Yes  . Sore throat     Plan: Begin amoxicillin, rest, hydrate well, salt gargles, and if not improving or worse, then recheck.  Discussed period of contagion.   Note given for work.

## 2016-08-17 ENCOUNTER — Encounter: Payer: Self-pay | Admitting: Medical

## 2016-08-17 ENCOUNTER — Ambulatory Visit (INDEPENDENT_AMBULATORY_CARE_PROVIDER_SITE_OTHER): Payer: BLUE CROSS/BLUE SHIELD | Admitting: Medical

## 2016-08-17 ENCOUNTER — Encounter: Payer: Self-pay | Admitting: Family Medicine

## 2016-08-17 ENCOUNTER — Ambulatory Visit
Admission: RE | Admit: 2016-08-17 | Discharge: 2016-08-17 | Disposition: A | Discharge status: Home / Self Care | Payer: BLUE CROSS/BLUE SHIELD | Source: Ambulatory Visit | Attending: Medical | Admitting: Medical

## 2016-08-17 VITALS — BP 120/72 | HR 71 | Wt 173.8 lb

## 2016-08-17 DIAGNOSIS — M25532 Pain in left wrist: Secondary | ICD-10-CM

## 2016-08-17 DIAGNOSIS — W19XXXA Unspecified fall, initial encounter: Secondary | ICD-10-CM | POA: Insufficient documentation

## 2016-08-17 DIAGNOSIS — M79642 Pain in left hand: Secondary | ICD-10-CM | POA: Diagnosis not present

## 2016-08-17 MED ORDER — HYDROCODONE-ACETAMINOPHEN 7.5-325 MG PO TABS: 1.0000 | ORAL_TABLET | Freq: Four times a day (QID) | ORAL | 0 refills | Status: DC | PRN
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
## 2016-08-17 NOTE — Patient Instructions (Signed)
RESOURCES in [place], [place]  If you are experiencing a mental health crisis or an emergency, please call 911 or go to the nearest emergency department.  [facility]   [phone] [facility]  [phone] [facility]   [phone]  Suicide Hotline 1-800-Suicide 567-615-7704)  National Suicide Prevention Lifeline (727)492-6640  (727) 017-7054)  Domestic Violence, Rape/Crisis - [facility] [place] 920-381-8736  The [employer] Violence Hotline 1-800-799-SAFE 905-574-1653)  To report Child or Elder Abuse, please call: [facility] Police Department  [id number] [facility] Department  [phone]  [place] (414) [phone]  Teen Crisis line 706-307-2907 or (623)282-0604      Psychiatry and [facility] office [place] [address], [place], [place] [address] Crisis services, Family support, in home therapy, treatment for Anxiety, PTSD, Sexual Assault, Substance Abuse, Financial/Credit Counseling, Variety of other services   [email or web address] [phone] office [place], [place] [address]  Larose Kells, Ph.D., [facility] Family, Couples, Anxiety, Depression, ADHD, Abuse, Anger Management Jonelle Sports, M.Ed., L[facility] Couples, Sexual orientation, Domestic violence, Child Abuse, Major Life Change,  Depression Clotilde Dieter, [place] Marriage counseling, Women's Issues, Depression, Intimacy, Career Issues Lennart Pall, Ph.D.,  L[facility] PTSD, Addictions, Grief, Anxiety, Sexual Orientation Krystal Eaton, [facility] Teen and child depression, anxiety, parenting challenges, Adult depression, self injury, relationship issues. Urbano Heir, [facility] Addiction, PTSD, Eating Disorders, Depression, Sexual Orientation Merilyn Baba, [facility] Eating disorder, Anxiety and Depression, Grief,  Divorce, Couples and Family Counseling, Parenting  Dr. Chucky May, psychiatry [phone] office [place] [address], [place], [place] [address]   [place] Address: [place] [address], [place], [place] [address] Hours: Open today  9AM-5PM Phone: [phone] Suggest an edit

## 2019-08-09 LAB — HM COLONOSCOPY

## 2019-08-10 ENCOUNTER — Encounter: Payer: Self-pay | Admitting: Medical

## 2020-09-20 ENCOUNTER — Ambulatory Visit (INDEPENDENT_AMBULATORY_CARE_PROVIDER_SITE_OTHER): Payer: 59

## 2020-09-20 ENCOUNTER — Other Ambulatory Visit: Payer: Self-pay

## 2020-09-20 ENCOUNTER — Ambulatory Visit (INDEPENDENT_AMBULATORY_CARE_PROVIDER_SITE_OTHER): Payer: 59 | Admitting: Podiatry

## 2020-09-20 DIAGNOSIS — M79673 Pain in unspecified foot: Secondary | ICD-10-CM

## 2020-09-20 DIAGNOSIS — Q828 Other specified congenital malformations of skin: Secondary | ICD-10-CM

## 2020-09-20 DIAGNOSIS — M79672 Pain in left foot: Secondary | ICD-10-CM

## 2020-09-20 DIAGNOSIS — M722 Plantar fascial fibromatosis: Secondary | ICD-10-CM | POA: Diagnosis not present

## 2020-09-20 DIAGNOSIS — B353 Tinea pedis: Secondary | ICD-10-CM

## 2020-09-20 DIAGNOSIS — M79671 Pain in right foot: Secondary | ICD-10-CM

## 2020-09-20 MED ORDER — TRIAMCINOLONE ACETONIDE 40 MG/ML IJ SUSP
10.0000 mg | Freq: Once | INTRAMUSCULAR | Status: AC
  Administered 2020-09-24: 10 mg

## 2020-09-20 MED ORDER — MELOXICAM 15 MG PO TABS: 15.0000 mg | ORAL_TABLET | Freq: Every day | ORAL | 3 refills | Status: DC

## 2020-09-20 MED ORDER — KETOCONAZOLE 2 % EX CREA: 1.0000 "application " | TOPICAL_CREAM | Freq: Every day | CUTANEOUS | 2 refills | Status: DC

## 2020-09-20 MED ORDER — DEXAMETHASONE SODIUM PHOSPHATE 4 MG/ML IJ SOLN
4.0000 mg | Freq: Once | INTRAMUSCULAR | Status: AC
  Administered 2020-09-24: 4 mg

## 2020-09-20 NOTE — Patient Instructions (Signed)

## 2020-09-24 DIAGNOSIS — M722 Plantar fascial fibromatosis: Secondary | ICD-10-CM | POA: Diagnosis not present

## 2020-09-24 NOTE — Progress Notes (Signed)
  Subjective:  Patient ID: Kathryn Lara, female    DOB: 16-Apr-1976,  MRN: [id number]  Chief Complaint  Patient presents with  . Foot Pain    left foot heel pain for a few months. PT stated that she has a burning sensation that is worse in the morning    44 y.o. female presents with the above complaint. History confirmed with patient.  She complains of dry scaling rash on the bottom of the feet.  She also has pain and burning in the heel.  Is worse when she gets up in the morning gets out of bed.  Objective:  Physical Exam: warm, good capillary refill, no trophic changes or ulcerative lesions, normal DP and PT pulses and normal sensory exam. Left Foot: Pain on palpation to plantar heel.  Dry scaling rash consistent with tinea pedis.  Submetatarsal 3 porokeratosis  Radiographs: X-ray of the left foot: no fracture, dislocation, swelling or degenerative changes noted and plantar calcaneal spur is present Assessment:   1. Heel pain, unspecified laterality   2. Plantar fasciitis of right foot      Plan:  Patient was evaluated and treated and all questions answered.  Discussed the etiology and treatment options for tinea pedis.  Discussed topical and oral treatment.  Recommended topical treatment with 2% ketoconazole cream.  This was sent to the patient's pharmacy.  Also discussed appropriate foot hygiene, use of antifungal spray such as Tinactin in shoes, as well as cleaning her foot surfaces such as showers and bathroom floors with bleach.  Discussed the etiology and treatment options for plantar fasciitis including stretching, formal physical therapy, supportive shoegears such as a running shoe or sneaker, pre fabricated orthoses, injection therapy, and oral medications. We also discussed the role of surgical treatment of this for patients who do not improve after exhausting non-surgical treatment options.  All symptomatic hyperkeratoses were safely debrided with a sterile #15 blade  to patient's level of comfort without incident. We discussed preventative and palliative care of these lesions including supportive and accommodative shoegear, padding, prefabricated and custom molded accommodative orthoses, use of a pumice stone and lotions/creams daily.    -XR reviewed with patient -Educated patient on stretching and icing of the affected limb -Plantar fascial brace dispensed -Injection delivered to the plantar fascia of the left foot. -Rx for meloxicam. Educated on use, risks and benefits of the medication  After sterile prep with povidone-iodine solution and alcohol, the left heel was injected with 1cc 2% xylocaine plain, 10mg  triamcinolone acetonide, and 4mg  dexamethasone was injected along the plantar fascia at the insertion on the plantar calcaneus. The patient tolerated the procedure well without complication.   Return in about 1 month (around 10/21/2020).

## 2020-10-19 DIAGNOSIS — K5090 Crohn's disease, unspecified, without complications: Secondary | ICD-10-CM

## 2020-10-19 HISTORY — DX: Crohn's disease, unspecified, without complications: K50.90

## 2020-10-25 ENCOUNTER — Ambulatory Visit: Payer: 59 | Admitting: Podiatry

## 2020-11-19 ENCOUNTER — Other Ambulatory Visit: Payer: Self-pay

## 2020-11-19 ENCOUNTER — Encounter: Payer: Self-pay | Admitting: Family Medicine

## 2020-11-19 ENCOUNTER — Ambulatory Visit (INDEPENDENT_AMBULATORY_CARE_PROVIDER_SITE_OTHER): Payer: Self-pay | Admitting: Family Medicine

## 2020-11-19 VITALS — BP 110/76 | HR 81 | Temp 97.5°F | Wt 191.0 lb

## 2020-11-19 DIAGNOSIS — S20211A Contusion of right front wall of thorax, initial encounter: Secondary | ICD-10-CM

## 2020-11-19 NOTE — Progress Notes (Signed)
   Subjective:    Patient ID: Kathryn Lara, female    DOB: 13-Apr-1976, 45 y.o.   MRN: [id number]  HPI She fell while on roller skates and tripped over a rug injuring her right lateral rib area on Saturday.  She continues to complain of rib pain but no shortness of breath, nausea, vomiting or abdominal pain.   Review of Systems     Objective:   Physical Exam Alert and complaining of right lateral rib pain.  Slight tenderness palpation in that area.  Splinting of the ribs cause less pain.  Lungs are clear to auscultation.  Abdominal exam shows no tenderness.       Assessment & Plan:  Contusion of rib on right side, initial encounter Take 2 Tylenol arthritis 3 times per day and you can also take 4 ibuprofen 3 times per day.  For the nausea use Emetrol Heat for 20 minutes 3 times per day can also help

## 2020-11-19 NOTE — Patient Instructions (Signed)
Take 2 Tylenol arthritis 3 times per day and you can also take 4 ibuprofen 3 times per day.  For the nausea use Emetrol Heat for 20 minutes 3 times per day can also help

## 2020-11-20 ENCOUNTER — Telehealth: Payer: Self-pay | Admitting: Family Medicine

## 2020-11-20 MED ORDER — TRAMADOL HCL 50 MG PO TABS
50.0000 mg | ORAL_TABLET | Freq: Three times a day (TID) | ORAL | 0 refills | Status: AC | PRN
Start: 2020-11-20 — End: 2020-11-25

## 2020-11-20 NOTE — Telephone Encounter (Signed)
Pt called she said she is in a lot of pain.  1 - 10 scale she is an 8. The Tylenol and Motrin are not working.  Please send her in something stronger to [place]

## 2020-11-20 NOTE — Telephone Encounter (Signed)
Pt called back and wanted to check the status on this. She also wants the medication sent to the [facility] on [address] instead

## 2021-02-17 ENCOUNTER — Ambulatory Visit (LOCAL_COMMUNITY_HEALTH_CENTER): Payer: Self-pay

## 2021-02-17 ENCOUNTER — Other Ambulatory Visit: Payer: Self-pay

## 2021-02-17 DIAGNOSIS — Z0184 Encounter for antibody response examination: Secondary | ICD-10-CM

## 2021-02-17 DIAGNOSIS — Z111 Encounter for screening for respiratory tuberculosis: Secondary | ICD-10-CM

## 2021-02-17 NOTE — Progress Notes (Signed)
PPD placed today. 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 ROI signed. Per [facility], pt has completed  Hep B vaccine series 2010. To lab for blood draw. Josie Saunders, RN

## 2021-02-18 LAB — HEPATITIS B SURFACE ANTIBODY, QUANTITATIVE: Hepatitis B Surf Ab Quant: 597.9 m[IU]/mL (ref >9.9)

## 2021-02-20 ENCOUNTER — Ambulatory Visit (LOCAL_COMMUNITY_HEALTH_CENTER): Payer: Self-pay

## 2021-02-20 ENCOUNTER — Other Ambulatory Visit: Payer: Self-pay

## 2021-02-20 DIAGNOSIS — Z0184 Encounter for antibody response examination: Secondary | ICD-10-CM

## 2021-02-20 DIAGNOSIS — Z111 Encounter for screening for respiratory tuberculosis: Secondary | ICD-10-CM

## 2021-02-20 LAB — TB SKIN TEST
Induration: 0 mm
TB Skin Test: NEGATIVE

## 2021-02-20 NOTE — Progress Notes (Signed)
PPDR  0 mm, negative. Hep B titer results (consistent with immunity) explained to pt.  Per [facility] Dr. Raliegh Ip. [place], No hep B vaccine indicated. Per pt request, updated [facility] copy given. Josie Saunders, RN

## 2021-10-19 DIAGNOSIS — A6000 Herpesviral infection of urogenital system, unspecified: Secondary | ICD-10-CM

## 2021-10-19 HISTORY — DX: Herpesviral infection of urogenital system, unspecified: A60.00

## 2022-07-28 ENCOUNTER — Encounter: Payer: Self-pay | Admitting: Internal Medicine

## 2022-07-28 ENCOUNTER — Ambulatory Visit: Payer: Managed Care, Other (non HMO) | Admitting: Medical

## 2022-07-28 VITALS — BP 110/68 | HR 86 | Ht 64.0 in | Wt 154.6 lb

## 2022-07-28 DIAGNOSIS — K921 Melena: Secondary | ICD-10-CM

## 2022-07-28 DIAGNOSIS — N924 Excessive bleeding in the premenopausal period: Secondary | ICD-10-CM

## 2022-07-28 DIAGNOSIS — Z862 Personal history of diseases of the blood and blood-forming organs and certain disorders involving the immune mechanism: Secondary | ICD-10-CM

## 2022-07-28 DIAGNOSIS — R109 Unspecified abdominal pain: Secondary | ICD-10-CM | POA: Diagnosis not present

## 2022-07-28 DIAGNOSIS — R634 Abnormal weight loss: Secondary | ICD-10-CM | POA: Diagnosis not present

## 2022-07-28 NOTE — Progress Notes (Signed)
Subjective:  Kathryn Lara is a 46 y.o. female who presents for Chief Complaint  Patient presents with   discuss corncerns    Discuss concerns- weight loss and crohns- stomach pain and hemorrhoids- can tell when she has a flare up. Rectal bleeding. Sometimes her iron is low. Having some stress and depression with crohns and not knowing what to do or anything.      Here for concerns  Gets abdominal pains on average every other day for a long time, months.   No recent flare ups with rectal bleeding.   When this does happen gets abdominal pain followed by bright red blood in stool.   Gets external hemorrhoids periodically.    Has been anemic in the past.   No prior blood transfusion or iron infusion.   Always has craving for ice.  Has been taking some iron supplement but not very consistent with this.  Is more consistent with greens.  Doesn't eat red meat.  Eats some chicken, [place], seafood.     She notes weight loss within last year.  Hasn't been exercising more to lose weight, but has noticed some weight loss.    Periods at times are heavy.     Drinks several alcohol drinks a week, maybe 7 or so.    Gets bowel urgency, gets relief of pain with bowel movement.   Gets loose stools periodically.    Has BM at least once daily.    Gets constipated occasionally.    No other aggravating or relieving factors.    No other c/o.  The following portions of the patient's history were reviewed and updated as appropriate: allergies, current medications, past family history, past medical history, past social history, past surgical history and problem list.  ROS Otherwise as in subjective above   Objective: BP 110/68   Pulse 86   Ht '5\' 4"'$  (1.626 m)   Wt 154 lb 9.6 oz (70.1 kg)   BMI 26.54 kg/m   General appearance: alert, no distress, well developed, well nourished Oral cavity: MMM, no lesions Neck: supple, no lymphadenopathy, no thyromegaly, no masses Heart: RRR, normal S1, S2, no  murmurs Lungs: CTA bilaterally, no wheezes, rhonchi, or rales Abdomen: +bs, soft, mild left and right lower abdominal tenderness, otherwise non tender, non distended, no masses, no hepatomegaly, no splenomegaly Pulses: 2+ radial pulses, 2+ pedal pulses, normal cap refill Ext: no edema   Assessment: Encounter Diagnoses  Name Primary?   Abdominal pain, unspecified abdominal location Yes   Blood in stool    Weight loss    History of anemia    Excessive bleeding in premenopausal period      Plan: We discussed her abdominal pain symptoms, weight loss, blood in the stool.  She saw gastroenterology back in 2020 and had a colonoscopy showing patchy mild inflammation in the ileocecal valve area, erosions in the terminal ileum, prominent internal hemorrhoids.  It was not clear that she had specifically Crohn's disease although there was a suspicion.  She has had ongoing abdominal pain for months occasional blood in the stool, occasional cramping and gas.  We discussed differential which could include IBS, inflammatory bowel disease, food intolerances, anemia or other issues.  Updated labs today  We discussed the weight loss in general which has been gradual in the last year but significant weight loss compared to February 2022 and she was 191 pounds.  This could be a combination of bowel disease, dietary changes or other.  We discussed other possible  causes of weight loss  Valisha was seen today for discuss corncerns.  Diagnoses and all orders for this visit:  Abdominal pain, unspecified abdominal location -     Comprehensive metabolic panel -     CBC -     TSH -     Iron  Blood in stool  Weight loss -     Comprehensive metabolic panel -     CBC -     TSH  History of anemia -     CBC -     Iron  Excessive bleeding in premenopausal period    Follow up: pending labs

## 2022-07-29 ENCOUNTER — Other Ambulatory Visit: Payer: Self-pay | Admitting: Medical

## 2022-07-29 ENCOUNTER — Other Ambulatory Visit: Payer: Self-pay | Admitting: Internal Medicine

## 2022-07-29 DIAGNOSIS — K921 Melena: Secondary | ICD-10-CM

## 2022-07-29 DIAGNOSIS — E611 Iron deficiency: Secondary | ICD-10-CM

## 2022-07-29 DIAGNOSIS — R634 Abnormal weight loss: Secondary | ICD-10-CM

## 2022-07-29 DIAGNOSIS — R109 Unspecified abdominal pain: Secondary | ICD-10-CM

## 2022-07-29 LAB — COMPREHENSIVE METABOLIC PANEL
ALT: 7 IU/L (ref 0–32)
AST: 14 IU/L (ref 0–40)
Albumin/Globulin Ratio: 1.6 (ref 1.2–2.2)
Albumin: 4.3 g/dL (ref 3.9–4.9)
Alkaline Phosphatase: 79 IU/L (ref 44–121)
BUN/Creatinine Ratio: 11 (ref 9–23)
BUN: 10 mg/dL (ref 6–24)
Bilirubin Total: 0.3 mg/dL (ref 0.0–1.2)
CO2: 24 mmol/L (ref 20–29)
Calcium: 9.5 mg/dL (ref 8.7–10.2)
Chloride: 104 mmol/L (ref 96–106)
Creatinine, Ser: 0.94 mg/dL (ref 0.57–1.00)
Globulin, Total: 2.7 g/dL (ref 1.5–4.5)
Glucose: 87 mg/dL (ref 70–99)
Potassium: 4.5 mmol/L (ref 3.5–5.2)
Sodium: 138 mmol/L (ref 134–144)
Total Protein: 7 g/dL (ref 6.0–8.5)
eGFR: 75 mL/min/{1.73_m2} (ref >59)

## 2022-07-29 LAB — CBC
Hematocrit: 32.4 % — ABNORMAL LOW (ref 34.0–46.6)
Hemoglobin: 10.4 g/dL — ABNORMAL LOW (ref 11.1–15.9)
MCH: 26.7 pg (ref 26.6–33.0)
MCHC: 32.1 g/dL (ref 31.5–35.7)
MCV: 83 fL (ref 79–97)
Platelets: 309 10*3/uL (ref 150–450)
RBC: 3.89 x10E6/uL (ref 3.77–5.28)
RDW: 16.6 % — ABNORMAL HIGH (ref 11.7–15.4)
WBC: 4.3 10*3/uL (ref 3.4–10.8)

## 2022-07-29 LAB — TSH: TSH: 0.478 u[IU]/mL (ref 0.450–4.500)

## 2022-07-29 LAB — IRON: Iron: 21 ug/dL — ABNORMAL LOW (ref 27–159)

## 2022-07-29 MED ORDER — FERROUS GLUCONATE 324 (38 FE) MG PO TABS: 324.0000 mg | ORAL_TABLET | Freq: Every day | ORAL | 1 refills | Status: DC

## 2022-10-08 LAB — HM PAP SMEAR
HM Pap smear: NORMAL
HPV, high-risk: NEGATIVE

## 2022-10-08 LAB — RESULTS CONSOLE HPV: CHL HPV: NEGATIVE

## 2022-10-20 ENCOUNTER — Other Ambulatory Visit: Payer: Self-pay | Admitting: Gastroenterology

## 2022-10-20 DIAGNOSIS — K50919 Crohn's disease, unspecified, with unspecified complications: Secondary | ICD-10-CM

## 2022-10-22 ENCOUNTER — Other Ambulatory Visit: Payer: Self-pay | Admitting: Obstetrics & Gynecology

## 2022-10-22 DIAGNOSIS — R928 Other abnormal and inconclusive findings on diagnostic imaging of breast: Secondary | ICD-10-CM

## 2022-10-28 ENCOUNTER — Ambulatory Visit: Payer: BLUE CROSS/BLUE SHIELD

## 2022-10-28 ENCOUNTER — Ambulatory Visit
Admission: RE | Admit: 2022-10-28 | Discharge: 2022-10-28 | Disposition: A | Discharge status: Home / Self Care | Payer: Managed Care, Other (non HMO) | Source: Ambulatory Visit | Attending: Obstetrics & Gynecology | Admitting: Obstetrics & Gynecology

## 2022-10-28 DIAGNOSIS — R928 Other abnormal and inconclusive findings on diagnostic imaging of breast: Secondary | ICD-10-CM

## 2022-11-03 ENCOUNTER — Encounter: Payer: Self-pay | Admitting: Medical

## 2022-11-03 ENCOUNTER — Ambulatory Visit: Payer: Managed Care, Other (non HMO) | Admitting: Medical

## 2022-11-03 VITALS — BP 118/78 | HR 92 | Temp 98.1°F | Ht 64.0 in | Wt 152.0 lb

## 2022-11-03 DIAGNOSIS — K921 Melena: Secondary | ICD-10-CM | POA: Insufficient documentation

## 2022-11-03 DIAGNOSIS — Z0000 Encounter for general adult medical examination without abnormal findings: Secondary | ICD-10-CM

## 2022-11-03 DIAGNOSIS — R41840 Attention and concentration deficit: Secondary | ICD-10-CM

## 2022-11-03 DIAGNOSIS — R7989 Other specified abnormal findings of blood chemistry: Secondary | ICD-10-CM | POA: Insufficient documentation

## 2022-11-03 DIAGNOSIS — D509 Iron deficiency anemia, unspecified: Secondary | ICD-10-CM | POA: Diagnosis not present

## 2022-11-03 DIAGNOSIS — Z13220 Encounter for screening for lipoid disorders: Secondary | ICD-10-CM | POA: Diagnosis not present

## 2022-11-03 DIAGNOSIS — A6000 Herpesviral infection of urogenital system, unspecified: Secondary | ICD-10-CM | POA: Insufficient documentation

## 2022-11-03 DIAGNOSIS — K50919 Crohn's disease, unspecified, with unspecified complications: Secondary | ICD-10-CM | POA: Insufficient documentation

## 2022-11-03 NOTE — Progress Notes (Signed)
Subjective:   HPI  Kathryn Lara is a 47 y.o. female who presents for Chief Complaint  Patient presents with   Annual Exam    CPE fasting labs, sinus drainage since before thanksgiving, and had a cough, grandson had flu B, feels better now though,     Patient Care Team: Arlissa Monteverde, Leward Quan as PCP - General (Family Medicine) Sees dentist Sees eye doctor Dr. Juanita Craver, GI Dr. Lanna Poche, gyn   Concerns: Here for a physical and some concerns   She was diagnosed with genital herpes about 2 years ago.  Uses Valtrex prophylactically.  She has questions about STD testing in general but just had STD testing recently gynecology  She is nonfasting today.  She ate some cake in the car on the way over here  She has chronic disease, been dealing with blood in the stool and recent weight loss.  Seeing gastroenterology.  Has some more testing coming up soon.  She has iron deficiency anemia-she is taking a multivitamin with extra iron  Her menstrual periods are regular, not heavy   Reviewed their medical, surgical, family, social, medication, and allergy history and updated chart as appropriate.  Past Medical History:  Diagnosis Date   Anemia    Crohn disease ([place]) 2022   Dr. Juanita Craver   Genital herpes 2023   Headache    Infection    UTI   Infertility, female    Missed abortion    MRSA (methicillin resistant Staphylococcus aureus)    reports +culture in breast ?2008   Tubo-ovarian abscess    Wears glasses     Family History  Problem Relation Age of Onset   Other Father        died of old age   Heart disease Neg Hx    Stroke Neg Hx    Hearing loss Neg Hx    Cancer Neg Hx    Asthma Neg Hx    Diabetes Neg Hx    BRCA 1/2 Neg Hx    Breast cancer Neg Hx      Current Outpatient Medications:    Multiple Vitamin (MULTIVITAMIN) tablet, Take 1 tablet by mouth daily. With iron, Disp: , Rfl:    valACYclovir (VALTREX) 500 MG tablet, Take 500 mg by mouth daily.,  Disp: , Rfl:    ferrous gluconate (FERGON) 324 MG tablet, Take 1 tablet (324 mg total) by mouth daily with breakfast., Disp: 90 tablet, Rfl: 1  Allergies  Allergen Reactions   Sulfa Antibiotics Hives   Review of Systems  Constitutional:  Negative for chills, fever, malaise/fatigue and weight loss.  HENT:  Negative for congestion, ear pain, hearing loss, sore throat and tinnitus.   Eyes:  Negative for blurred vision, pain and redness.  Respiratory:  Negative for cough, hemoptysis and shortness of breath.   Cardiovascular:  Negative for chest pain, palpitations, orthopnea, claudication and leg swelling.  Gastrointestinal:  Positive for abdominal pain, blood in stool and diarrhea. Negative for constipation, nausea and vomiting.  Genitourinary:  Negative for dysuria, flank pain, frequency, hematuria and urgency.  Musculoskeletal:  Negative for falls, joint pain and myalgias.  Skin:  Negative for itching and rash.  Neurological:  Negative for dizziness, tingling, speech change, weakness and headaches.  Endo/Heme/Allergies:  Negative for polydipsia. Does not bruise/bleed easily.  Psychiatric/Behavioral:  Negative for depression and memory loss. The patient is not nervous/anxious and does not have insomnia.          11/03/2022   12:10  PM 11/03/2022   11:29 AM 07/28/2022    8:41 AM  Depression screen PHQ 2/9  Decreased Interest 1 0 1  Down, Depressed, Hopeless 1 0 1  PHQ - 2 Score 2 0 2  Altered sleeping 2  1  Tired, decreased energy 2  3  Change in appetite 0  3  Feeling bad or failure about yourself  1  1  Trouble concentrating 3  1  Moving slowly or fidgety/restless 0  1  Suicidal thoughts 0  0  PHQ-9 Score 10  12  Difficult doing work/chores Somewhat difficult  Somewhat difficult       Objective:  BP 118/78   Pulse 92   Temp 98.1 F (36.7 C)   Ht '5\' 4"'$  (1.626 m)   Wt 152 lb (68.9 kg)   LMP 10/11/2022 (Exact Date)   BMI 26.09 kg/m   General appearance: alert, no  distress, WD/WN, African American female Skin: unremarkable, tattoo left lateral neck HEENT: normocephalic, conjunctiva/corneas normal, sclerae anicteric, PERRLA, EOMi, nares patent, no discharge or erythema, pharynx normal Oral cavity: MMM, tongue normal, teeth normal Neck: supple, no lymphadenopathy, no thyromegaly, no masses, normal ROM, no bruits Chest: non tender, normal shape and expansion Heart: RRR, normal S1, S2, no murmurs Lungs: CTA bilaterally, no wheezes, rhonchi, or rales Abdomen: +bs, soft, non tender, non distended, no masses, no hepatomegaly, no splenomegaly, no bruits Back: non tender, normal ROM, no scoliosis Musculoskeletal: upper extremities non tender, no obvious deformity, normal ROM throughout, lower extremities non tender, no obvious deformity, normal ROM throughout Extremities: no edema, no cyanosis, no clubbing Pulses: 2+ symmetric, upper and lower extremities, normal cap refill Neurological: alert, oriented x 3, CN2-12 intact, strength normal upper extremities and lower extremities, sensation normal throughout, DTRs 2+ throughout, no cerebellar signs, gait normal Psychiatric: normal affect, behavior normal, pleasant  Breast/gyn/rectal - deferred to gynecology     Assessment and Plan :   Encounter Diagnoses  Name Primary?   Encounter for health maintenance examination in adult Yes   Abnormal thyroid blood test    Iron deficiency anemia, unspecified iron deficiency anemia type    Screening for lipid disorders    Crohn's disease with complication, unspecified gastrointestinal tract location ([place])    Blood in stool    Attention deficit    Genital herpes simplex, unspecified site      This visit was a preventative care visit, also known as wellness visit or routine physical.   Topics typically include healthy lifestyle, diet, exercise, preventative care, vaccinations, sick and well care, proper use of emergency dept and after hours care, as well as other  concerns.     Recommendations: Continue to return yearly for your annual wellness and preventative care visits.  This gives [place] a chance to discuss healthy lifestyle, exercise, vaccinations, review your chart record, and perform screenings where appropriate.  I recommend you see your eye doctor yearly for routine vision care.  I recommend you see your dentist yearly for routine dental care including hygiene visits twice yearly.  See your gynecologist yearly for routine gynecological care.   Vaccination recommendations were reviewed Immunization History  Administered Date(s) Administered   PPD Test 02/17/2021   Tdap 05/08/2015   Declines flu vaccine   Screening for cancer: Colon cancer screening: I reviewed your colonoscopy on file that is up to date from 2020  Breast cancer screening: You should perform a self breast exam monthly.   We reviewed recommendations for regular mammograms and breast cancer  screening.  Cervical cancer screening: We reviewed recommendations for pap smear screening.  We will request recent pap smear from gyn   Skin cancer screening: Check your skin regularly for new changes, growing lesions, or other lesions of concern Come in for evaluation if you have skin lesions of concern.  Lung cancer screening: If you have a greater than 20 pack year history of tobacco use, then you may qualify for lung cancer screening with a chest CT scan.   Please call your insurance company to inquire about coverage for this test.  We currently don't have screenings for other cancers besides breast, cervical, colon, and lung cancers.  If you have a strong family history of cancer or have other cancer screening concerns, please let me know.    Bone health: Get at least 150 minutes of aerobic exercise weekly Get weight bearing exercise at least once weekly Bone density test:  A bone density test is an imaging test that uses a type of X-ray to measure the amount of  calcium and other minerals in your bones. The test may be used to diagnose or screen you for a condition that causes weak or thin bones (osteoporosis), predict your risk for a broken bone (fracture), or determine how well your osteoporosis treatment is working. The bone density test is recommended for females 69 and older, or females or males <95 if certain risk factors such as thyroid disease, long term use of steroids such as for asthma or rheumatological issues, vitamin D deficiency, estrogen deficiency, family history of osteoporosis, self or family history of fragility fracture in first degree relative.    Heart health: Get at least 150 minutes of aerobic exercise weekly Limit alcohol It is important to maintain a healthy blood pressure and healthy cholesterol numbers  Heart disease screening: Screening for heart disease includes screening for blood pressure, fasting lipids, glucose/diabetes screening, BMI height to weight ratio, reviewed of smoking status, physical activity, and diet.    Goals include blood pressure 120/80 or less, maintaining a healthy lipid/cholesterol profile, preventing diabetes or keeping diabetes numbers under good control, not smoking or using tobacco products, exercising most days per week or at least 150 minutes per week of exercise, and eating healthy variety of fruits and vegetables, healthy oils, and avoiding unhealthy food choices like fried food, fast food, high sugar and high cholesterol foods.    Other tests may possibly include EKG test, CT coronary calcium score, echocardiogram, exercise treadmill stress test.    Medical care options: I recommend you continue to seek care here first for routine care.  We try really hard to have available appointments Monday through Friday daytime hours for sick visits, acute visits, and physicals.  Urgent care should be used for after hours and weekends for significant issues that cannot wait till the next day.  The  emergency department should be used for significant potentially life-threatening emergencies.  The emergency department is expensive, can often have long wait times for less significant concerns, so try to utilize primary care, urgent care, or telemedicine when possible to avoid unnecessary trips to the emergency department.  Virtual visits and telemedicine have been introduced since the pandemic started in 2020, and can be convenient ways to receive medical care.  We offer virtual appointments as well to assist you in a variety of options to seek medical care.   Advanced Directives: I recommend you consider completing a [place] and Living Will.   These documents respect your  wishes and help alleviate burdens on your loved ones if you were to become terminally ill or be in a position to need those documents enforced.    You can complete Advanced Directives yourself, have them notarized, then have copies made for our office, for you and for anybody you feel should have them in safe keeping.  Or, you can have an attorney prepare these documents.   If you haven't updated your Last Will and Testament in a while, it may be worthwhile having an attorney prepare these documents together and save on some costs.       Separate significant issues discussed: Crohn's disease-managed by gastroenterology, follow-up soon with gastroenterology as planned  Iron deficiency anemia-update labs today, continue multivitamin with iron  Recent abnormal thyroid labs-additional labs today, may need to do see endocrinology.  She has had some recent headaches and palpitations and weight loss  Return at your convenience for lipid screening fasting  Genital herpes-we discussed prophylactic versus acute therapy, prevention  Attention deficit concerns-we do not have enough time allotted at this visit for full evaluation.  I did some baseline screening questionnaires today   Alzora was seen today for  annual exam.  Diagnoses and all orders for this visit:  Encounter for health maintenance examination in adult -     TSH + free T4 -     T3 -     Iron, TIBC and Ferritin Panel -     Folate -     Vitamin B12  Abnormal thyroid blood test -     TSH + free T4 -     T3  Iron deficiency anemia, unspecified iron deficiency anemia type -     Iron, TIBC and Ferritin Panel -     Folate -     Vitamin B12  Screening for lipid disorders  Crohn's disease with complication, unspecified gastrointestinal tract location ([place])  Blood in stool  Attention deficit  Genital herpes simplex, unspecified site    Follow-up pending labs, yearly for physical

## 2022-11-04 ENCOUNTER — Other Ambulatory Visit: Payer: Self-pay | Admitting: Medical

## 2022-11-04 DIAGNOSIS — R41840 Attention and concentration deficit: Secondary | ICD-10-CM

## 2022-11-04 LAB — VITAMIN B12: Vitamin B-12: 418 pg/mL (ref 232–1245)

## 2022-11-04 LAB — IRON,TIBC AND FERRITIN PANEL
Ferritin: 13 ng/mL — ABNORMAL LOW (ref 15–150)
Iron Saturation: 12 % — ABNORMAL LOW (ref 15–55)
Iron: 37 ug/dL (ref 27–159)
Total Iron Binding Capacity: 298 ug/dL (ref 250–450)
UIBC: 261 ug/dL (ref 131–425)

## 2022-11-04 LAB — T3: T3, Total: 109 ng/dL (ref 71–180)

## 2022-11-04 LAB — TSH+FREE T4
Free T4: 1.26 ng/dL (ref 0.82–1.77)
TSH: 0.479 u[IU]/mL (ref 0.450–4.500)

## 2022-11-04 LAB — FOLATE: Folate: 9.1 ng/mL (ref >3.0)

## 2022-11-04 MED ORDER — FERROUS GLUCONATE 324 (38 FE) MG PO TABS: 324.0000 mg | ORAL_TABLET | Freq: Every day | ORAL | 0 refills | Status: DC

## 2022-11-04 NOTE — Progress Notes (Signed)
Results sent through MyChart

## 2022-11-06 ENCOUNTER — Encounter: Payer: Self-pay | Admitting: *Deleted

## 2022-11-17 ENCOUNTER — Ambulatory Visit
Admission: RE | Admit: 2022-11-17 | Discharge: 2022-11-17 | Disposition: A | Discharge status: Home / Self Care | Payer: Managed Care, Other (non HMO) | Source: Ambulatory Visit | Attending: Gastroenterology | Admitting: Gastroenterology

## 2022-11-17 DIAGNOSIS — K50919 Crohn's disease, unspecified, with unspecified complications: Secondary | ICD-10-CM

## 2022-11-17 MED ORDER — IOPAMIDOL (ISOVUE-300) INJECTION 61%
100.0000 mL | Freq: Once | INTRAVENOUS | Status: AC | PRN
  Administered 2022-11-17: 100 mL via INTRAVENOUS

## 2022-11-24 ENCOUNTER — Other Ambulatory Visit (HOSPITAL_COMMUNITY): Payer: Self-pay | Admitting: Gastroenterology

## 2022-11-24 DIAGNOSIS — R935 Abnormal findings on diagnostic imaging of other abdominal regions, including retroperitoneum: Secondary | ICD-10-CM

## 2022-12-04 ENCOUNTER — Telehealth: Payer: Self-pay | Admitting: Medical

## 2022-12-04 MED ORDER — FERROUS GLUCONATE 324 (38 FE) MG PO TABS: 324.0000 mg | ORAL_TABLET | Freq: Every day | ORAL | 0 refills | Status: DC

## 2022-12-04 NOTE — Telephone Encounter (Signed)
Pt requesting refill on [place] to [facility], [place]

## 2022-12-04 NOTE — Telephone Encounter (Signed)
done 

## 2022-12-10 ENCOUNTER — Telehealth: Payer: Self-pay | Admitting: Internal Medicine

## 2022-12-10 ENCOUNTER — Encounter (HOSPITAL_COMMUNITY)
Admission: RE | Admit: 2022-12-10 | Discharge: 2022-12-10 | Disposition: A | Discharge status: Home / Self Care | Payer: Managed Care, Other (non HMO) | Source: Ambulatory Visit | Attending: Gastroenterology | Admitting: Gastroenterology

## 2022-12-10 DIAGNOSIS — R599 Enlarged lymph nodes, unspecified: Secondary | ICD-10-CM

## 2022-12-10 DIAGNOSIS — R935 Abnormal findings on diagnostic imaging of other abdominal regions, including retroperitoneum: Secondary | ICD-10-CM | POA: Diagnosis present

## 2022-12-10 DIAGNOSIS — R911 Solitary pulmonary nodule: Secondary | ICD-10-CM

## 2022-12-10 DIAGNOSIS — R9389 Abnormal findings on diagnostic imaging of other specified body structures: Secondary | ICD-10-CM

## 2022-12-10 MED ORDER — COPPER CU 64 DOTATATE 1 MCI/ML IV SOLN
4.0000 | Freq: Once | INTRAVENOUS | Status: AC
  Administered 2022-12-10: 3.84 via INTRAVENOUS

## 2022-12-10 NOTE — Telephone Encounter (Signed)
Pt called and states that she needs a referral to lung specialist due to her abnormal scan. Please advise. Pt wanted you to talk to her about her scan but advised that you were busy

## 2022-12-11 ENCOUNTER — Other Ambulatory Visit: Payer: Self-pay | Admitting: Medical

## 2022-12-11 ENCOUNTER — Telehealth: Payer: Self-pay | Admitting: Internal Medicine

## 2022-12-11 NOTE — Telephone Encounter (Signed)
Pt was notified and I have placed referrals to both oncology and pulmonology

## 2022-12-11 NOTE — Telephone Encounter (Signed)
Scheduled appt per 2/23 referral. Pt is aware of appt date and time. Pt is aware to arrive 15 mins prior to appt time and to bring and updated insurance card. Pt is aware of appt location.   °

## 2022-12-14 ENCOUNTER — Other Ambulatory Visit: Payer: Self-pay | Admitting: Medical Oncology

## 2022-12-14 DIAGNOSIS — D509 Iron deficiency anemia, unspecified: Secondary | ICD-10-CM

## 2022-12-15 ENCOUNTER — Inpatient Hospital Stay: Payer: Managed Care, Other (non HMO)

## 2022-12-15 ENCOUNTER — Other Ambulatory Visit: Payer: Self-pay

## 2022-12-15 ENCOUNTER — Inpatient Hospital Stay: Payer: Managed Care, Other (non HMO) | Attending: Internal Medicine | Admitting: Internal Medicine

## 2022-12-15 VITALS — BP 113/67 | HR 81 | Temp 98.2°F | Resp 16 | Wt 153.0 lb

## 2022-12-15 DIAGNOSIS — J984 Other disorders of lung: Secondary | ICD-10-CM

## 2022-12-15 DIAGNOSIS — R911 Solitary pulmonary nodule: Secondary | ICD-10-CM | POA: Diagnosis not present

## 2022-12-15 DIAGNOSIS — K50911 Crohn's disease, unspecified, with rectal bleeding: Secondary | ICD-10-CM | POA: Diagnosis not present

## 2022-12-15 DIAGNOSIS — D509 Iron deficiency anemia, unspecified: Secondary | ICD-10-CM

## 2022-12-15 DIAGNOSIS — Z87891 Personal history of nicotine dependence: Secondary | ICD-10-CM | POA: Insufficient documentation

## 2022-12-15 DIAGNOSIS — R0602 Shortness of breath: Secondary | ICD-10-CM | POA: Insufficient documentation

## 2022-12-15 DIAGNOSIS — R0789 Other chest pain: Secondary | ICD-10-CM | POA: Insufficient documentation

## 2022-12-15 DIAGNOSIS — R059 Cough, unspecified: Secondary | ICD-10-CM | POA: Diagnosis not present

## 2022-12-15 LAB — CMP (CANCER CENTER ONLY)
ALT: 8 U/L (ref 0–44)
AST: 11 U/L — ABNORMAL LOW (ref 15–41)
Albumin: 3.7 g/dL (ref 3.5–5.0)
Alkaline Phosphatase: 43 U/L (ref 38–126)
Anion gap: 3 — ABNORMAL LOW (ref 5–15)
BUN: 9 mg/dL (ref 6–20)
CO2: 29 mmol/L (ref 22–32)
Calcium: 8.9 mg/dL (ref 8.9–10.3)
Chloride: 106 mmol/L (ref 98–111)
Creatinine: 0.94 mg/dL (ref 0.44–1.00)
GFR, Estimated: >60 mL/min (ref >60)
Glucose, Bld: 99 mg/dL (ref 70–99)
Potassium: 4.5 mmol/L (ref 3.5–5.1)
Sodium: 138 mmol/L (ref 135–145)
Total Bilirubin: 0.3 mg/dL (ref 0.3–1.2)
Total Protein: 6.6 g/dL (ref 6.5–8.1)

## 2022-12-15 LAB — CBC WITH DIFFERENTIAL (CANCER CENTER ONLY)
Abs Immature Granulocytes: 0.01 10*3/uL (ref 0.00–0.07)
Basophils Absolute: 0 10*3/uL (ref 0.0–0.1)
Basophils Relative: 1 %
Eosinophils Absolute: 0.3 10*3/uL (ref 0.0–0.5)
Eosinophils Relative: 5 %
HCT: 26.6 % — ABNORMAL LOW (ref 36.0–46.0)
Hemoglobin: 8.7 g/dL — ABNORMAL LOW (ref 12.0–15.0)
Immature Granulocytes: 0 %
Lymphocytes Relative: 35 %
Lymphs Abs: 1.8 10*3/uL (ref 0.7–4.0)
MCH: 28.1 pg (ref 26.0–34.0)
MCHC: 32.7 g/dL (ref 30.0–36.0)
MCV: 85.8 fL (ref 80.0–100.0)
Monocytes Absolute: 0.5 10*3/uL (ref 0.1–1.0)
Monocytes Relative: 9 %
Neutro Abs: 2.5 10*3/uL (ref 1.7–7.7)
Neutrophils Relative %: 50 %
Platelet Count: 276 10*3/uL (ref 150–400)
RBC: 3.1 MIL/uL — ABNORMAL LOW (ref 3.87–5.11)
RDW: 16 % — ABNORMAL HIGH (ref 11.5–15.5)
WBC Count: 5.1 10*3/uL (ref 4.0–10.5)
nRBC: 0 % (ref 0.0–0.2)

## 2022-12-15 NOTE — Progress Notes (Signed)
[place] Telephone:([phone]) [phone]   Fax:(336) [phone]  CONSULT NOTE  REFERRING PHYSICIAN: Chana Bode, PA-C  REASON FOR CONSULTATION:  47 years old African-American female with suspicious lung cancer   HPI Kathryn Lara is a 47 y.o. female with past medical history significant for anemia, chron's disease followed by Dr. Collene Mares, UTI, MRSA infection as well as tubo-ovarian abscess.  The patient was evaluated for rectal bleeding with CT of the abdomen pelvis with contrast (enterography) performed on November 17, 2022 and that showed prominent peripancreatic and porta hepatis lymph node with borderline enlarged lower periaortic lymph node and prominent right paraspinal tissue and the lower thorax and the possibility include reactive adenopathy versus lymphoma.  There was also a pleural-based lesion along the right lateral chest adjacent to the lower lobe measuring 1.6 x 0.5 cm new from the prior exam and is nonspecific and could be inflammatory or postinflammatory.  The scan also showed newly appreciable 0.7 cm enhancing lesion in the lateral segment of the left hepatic lobe probably flash filling hemangioma but was not seen on previous scan of 05/20/2014.  The patient had a PET dotatate scan on 12/10/2022 and that showed cavitary mass within the right upper lobe with differential including bronchogenic carcinoma versus pulmonary infection including fungal infection or tuberculosis.  There was potential mediastinal lymphadenopathy and irregular FDG PET scan was recommended.  There was no evidence of well-differentiated neuroendocrine tumor on the dotatate PET scan.  There was mild activity associated within the left inguinal lymph node that is nonspecific. She was referred to me today for evaluation and recommendation regarding her condition. When seen today she continues to complain of fatigue.  She also has central chest pain with mild shortness of breath and cough with no  hemoptysis.  She lost around 20 pounds in the last year.  She has no nausea, vomiting, diarrhea but has intermittent constipation.  She has a history of bloody stool likely secondary to hemorrhoids.  She has no headache or visual changes.  Family history significant for father died from old age and mother still alive. The patient is a widow.  Her husband committed suicide at age 57.  She has 2 children and 1 grandchild.  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  She has a history of smoking marijuana for around 10 years but she does not smoke cigarettes.  She also drinks 1-2 alcoholic drinks every day. HPI  Past Medical History:  Diagnosis Date   Anemia    Crohn disease ([place]) 2022   Dr. Juanita Craver   Genital herpes 2023   Headache    Infection    UTI   Infertility, female    Missed abortion    MRSA (methicillin resistant Staphylococcus aureus)    reports +culture in breast ?2008   Tubo-ovarian abscess    Wears glasses     Past Surgical History:  Procedure Laterality Date   CESAREAN SECTION N/A 07/14/2015   Procedure: CESAREAN SECTION;  Surgeon: Everlene Farrier, MD;  Location: [facility];  Service: Obstetrics;  Laterality: N/A;   CYSTOSCOPY  05/04/2014   Procedure: CYSTOSCOPY;  Surgeon: Darlyn Chamber, MD;  Location: [facility];  Service: Gynecology;;   DILATION AND CURETTAGE OF UTERUS N/A 04/17/2014   Procedure: SUCTION DILATATION AND CURETTAGE;  Surgeon: Governor Specking, MD;  Location: [facility];  Service: Gynecology;  Laterality: N/A;   LAPAROTOMY N/A 05/04/2014   Procedure: EXPLORATORY LAPAROTOMY;  Surgeon: Darlyn Chamber,  MD;  Location: [facility];  Service: Gynecology;  Laterality: N/A;   OVARIAN EGG RETRIEVAL  03-09-2014   SALPINGOOPHORECTOMY     Right side only   WISDOM TOOTH EXTRACTION     WOUND EXPLORATION N/A 07/16/2015   Procedure: WOUND EXPLORATION with INCISION REVISION;  Surgeon: Louretta Shorten, MD;  Location: [facility];  Service: Gynecology;  Laterality: N/A;     Family History  Problem Relation Age of Onset   Other Father        died of old age   Heart disease Neg Hx    Stroke Neg Hx    Hearing loss Neg Hx    Cancer Neg Hx    Asthma Neg Hx    Diabetes Neg Hx    BRCA 1/2 Neg Hx    Breast cancer Neg Hx     Social History Social History   Tobacco Use   Smoking status: Former    Types: Cigarettes    Quit date: 01/14/2014    Years since quitting: 8.9   Smokeless tobacco: Never   Tobacco comments:    WAS A SOCIAL SMOKER  Substance Use Topics   Alcohol use: No    Comment: OCCASIONAL   Drug use: Yes    Types: Marijuana    Allergies  Allergen Reactions   Sulfa Antibiotics Hives    Current Outpatient Medications  Medication Sig Dispense Refill   ferrous gluconate (FERGON) 324 MG tablet Take 1 tablet (324 mg total) by mouth daily with breakfast. 90 tablet 0   Multiple Vitamin (MULTIVITAMIN) tablet Take 1 tablet by mouth daily. With iron     valACYclovir (VALTREX) 500 MG tablet Take 500 mg by mouth daily.     No current facility-administered medications for this visit.    Review of Systems  Constitutional: positive for fatigue and weight loss Eyes: negative Ears, nose, mouth, throat, and face: negative Respiratory: positive for cough, dyspnea on exertion, and pleurisy/chest pain Cardiovascular: negative Gastrointestinal: positive for constipation Genitourinary:negative Integument/breast: negative Hematologic/lymphatic: negative Musculoskeletal:negative Neurological: negative Behavioral/Psych: negative Endocrine: negative Allergic/Immunologic: negative  Physical Exam  [id number], healthy, no distress, well nourished, well developed, and anxious SKIN: skin color, texture, turgor are normal, no rashes or significant lesions HEAD: Normocephalic, No masses, lesions, tenderness or abnormalities EYES: normal, PERRLA, Conjunctiva are pink and non-injected EARS: External ears normal, Canals clear OROPHARYNX:no exudate, no  erythema, and lips, buccal mucosa, and tongue normal  NECK: supple, no adenopathy, no JVD LYMPH:  no palpable lymphadenopathy, no hepatosplenomegaly BREAST:not examined LUNGS: clear to auscultation , and palpation HEART: regular rate & rhythm, no murmurs, and no gallops ABDOMEN:abdomen soft, non-tender, normal bowel sounds, and no masses or organomegaly BACK: Back symmetric, no curvature., No CVA tenderness EXTREMITIES:no joint deformities, effusion, or inflammation, no edema  NEURO: alert & oriented x 3 with fluent speech, no focal motor/sensory deficits  PERFORMANCE STATUS: ECOG 1  LABORATORY DATA: Lab Results  Component Value Date   WBC 4.3 07/28/2022   HGB 10.4 (L) 07/28/2022   HCT 32.4 (L) 07/28/2022   MCV 83 07/28/2022   PLT 309 07/28/2022      Chemistry      Component Value Date/Time   NA 138 07/28/2022 0915   K 4.5 07/28/2022 0915   CL 104 07/28/2022 0915   CO2 24 07/28/2022 0915   BUN 10 07/28/2022 0915   CREATININE 0.94 07/28/2022 0915   CREATININE 0.76 11/21/2012 1728      Component Value Date/Time   CALCIUM 9.5 07/28/2022 0915  ALKPHOS 79 07/28/2022 0915   AST 14 07/28/2022 0915   ALT 7 07/28/2022 0915   BILITOT 0.3 07/28/2022 0915       RADIOGRAPHIC STUDIES: NM PET DOTATATE SKULL BASE TO MID THIGH  Result Date: 12/10/2022 CLINICAL DATA:  Abdominal pain.  Concern for neuroendocrine tumor. EXAM: NUCLEAR MEDICINE PET SKULL BASE TO THIGH TECHNIQUE: 3.8 mCi copper 66 DOTATATE was injected intravenously. Full-ring PET imaging was performed from the skull base to thigh after the radiotracer. CT data was obtained and used for attenuation correction and anatomic localization. COMPARISON:  CT 11/17/2022 FINDINGS: NECK No radiotracer activity in neck lymph nodes. Incidental CT findings: None CHEST Thick-walled cavitary lesion in the RIGHT upper lobe measures 58 mm x 39 mm (image 50/CT series 4). There is nodular extension towards the RIGHT hilum (image 54). Cavitary  mass abuts the RIGHT upper lobe pleural surface superiorly. There is no significant radiotracer activity associated with this cavitary mass. Mediastinal structures are poorly delineated on noncontrast exam. Concern for enlarged subcarinal lymph node measuring 16 mm. 9 mm LEFT supraclavicular node noted. Incidental CT finding:None ABDOMEN/PELVIS No abnormal radiotracer activity within the small bowel or colon to localize neuroendocrine tumor. No abnormal radiotracer activity in the pancreas. No evidence of neuroendocrine tumor liver metastasis. Single radiotracer avid LEFT inguinal lymph node measures 12 mm (image 176) with SUV max equal 4.3. Physiologic activity noted in the liver, spleen, adrenal glands and kidneys. Incidental CT findings:None SKELETON No focal activity to suggest skeletal metastasis. Incidental CT findings:None IMPRESSION: 1. Cavitary mass within the RIGHT upper lobe with differential including bronchogenic carcinoma versus pulmonary infection including fungal infection or tuberculosis. Potential mediastinal lymphadenopathy. Consider FDG PET scan for further characterization and tissu sample planning. 2. No evidence of well differentiated neuroendocrine tumor on DOTATATE PET scan. 3. Mild activity associated within a LEFT inguinal lymph node is nonspecific. These results will be called to the ordering clinician or representative by the Radiologist Assistant, and communication documented in the PACS or [employer]. Electronically Signed   By: Suzy Bouchard M.D.   On: 12/10/2022 12:39   CT ENTERO ABD/PELVIS W CONTAST  Result Date: 11/18/2022 CLINICAL DATA:  Crohn's disease.  Blood in stool. EXAM: CT ABDOMEN AND PELVIS WITH CONTRAST (ENTEROGRAPHY) TECHNIQUE: Multidetector CT of the abdomen and pelvis during bolus administration of intravenous contrast. Negative oral contrast was given. RADIATION DOSE REDUCTION: This exam was performed according to the departmental dose-optimization  program which includes automated exposure control, adjustment of the mA and/or kV according to patient size and/or use of iterative reconstruction technique. CONTRAST:  161m ISOVUE-300 IOPAMIDOL (ISOVUE-300) INJECTION 61% COMPARISON:  05/20/2014 FINDINGS: Lower chest: Right paraesophageal lymph node at the hiatus 0.7 cm in short axis. Prominent right paraspinal tissues in the lower thorax, measuring up to 0.8 cm in thickness on image 92 of series 5 to the right of the T12 vertebral body. Suspected lower periaortic adenopathy measuring up to 1.1 cm in short axis on image 1 series 5. A pleural-based lesion along the right lateral chest adjacent to the lower lobe measures 1.6 by 0.5 cm on image 5 series 4 and does not appear calcified. Hepatobiliary: Enhancing 0.7 cm lesion in the lateral segment left hepatic lobe on image 20 series 2, probably a flash filling hemangioma, but not readily visible on 05/20/2014. Mildly contracted gallbladder. Pancreas: Unremarkable Spleen: Unremarkable Adrenals/Urinary Tract: Unremarkable Stomach/Bowel: Prominent stool throughout the colon favors constipation. Normal small bowel caliber. Expected jejunal folds and normal appearance of the ileum. No  abnormal inflammatory findings in the terminal ileum. Thick-walled gastric antrum probably from contraction, less likely antritis. Duodenal bulb unremarkable. Dilated appendix containing gas and some high density material, the appendix is 10 mm in diameter. This appendiceal prominence/dilation is also shown on 05/20/2014. I cannot completely exclude the possibility of an enhancing appendiceal luminal lesion. The appendix is lumen is high density rather than low-density, so this is not a typical mucocele. Vascular/Lymphatic: There is notable peripancreatic and porta hepatis adenopathy. 1.5 cm porta hepatis node on image 109 series 5. Another of the find porta hepatis node likewise measures 1.7 cm on the same image. Mildly prominent portacaval  lymph node. Right gastric node 1.1 cm in short axis on image 90 series 5. Suspected large lymph node along the splenic hilum adjacent to the splenic vasculature, measuring 0.9 cm in short axis on image 58 series 6. Left inguinal lymph node 1.2 cm in short axis on image 88 series 2, upper normal for this location. Reproductive: Nonspecific fullness of the cervix. This can be normal, but correlation with the patient's cervical screening history is recommended. 1.3 cm posterior intramural mass in the uterus, likely a small fibroid. Other: No supplemental non-categorized findings. Musculoskeletal: Unremarkable IMPRESSION: 1. Prominent stool throughout the colon favors constipation. 2. No findings of active Crohn's disease. 3. Prominent peripancreatic and porta hepatis lymph nodes with borderline enlarged lower periaortic lymph nodes and prominent right paraspinal tissues in the lower thorax. Possibilities include reactive adenopathy or lymphoma. No splenic parenchymal lesion is identified. Consider surveillance imaging, or if there is a high clinical index of suspicion for potential malignancy, PET-CT. 4. There is also a pleural-based lesion along the right lateral chest adjacent to the lower lobe measuring 1.6 by 0.5 cm, new from the prior exam and nonspecific. This could well be inflammatory or postinflammatory. 5. Newly appreciable 0.7 cm enhancing lesion in the lateral segment left hepatic lobe, probably a flash filling hemangioma but not readily visible on 05/20/2014. 6. Dilated appendix containing gas and some high density material, the appendix is dilated up to 10 mm in diameter. This was also shown on 05/20/2014. I cannot completely exclude the possibility of an enhancing appendiceal luminal lesion, although the high density contents could well represent appendicolith. I am skeptical of acute appendicitis given the chronic appearance. 7. Nonspecific fullness of the cervix. This can be normal, but correlation  with the patient's cervical screening history is recommended. 8. 1.3 cm posterior intramural mass in the uterus, likely a small fibroid. 9. Thick-walled gastric antrum probably from contraction, less likely antritis. Electronically Signed   By: Van Clines M.D.   On: 11/18/2022 11:30    ASSESSMENT: This is a very pleasant 47 years old African-American female presented with large cavitary mass in the right upper lobe suspicious for bronchogenic carcinoma versus pulmonary infection including fungal infection or tuberculosis.  The patient also has mild mediastinal lymphadenopathy with no extrathoracic lesions.   PLAN: I had a lengthy discussion with the patient today about her current condition and further investigation to confirm the underlying etiology of her condition. I personally and independently reviewed the scan images and discussed the results and showed the images to the patient today. I recommended for the patient to see pulmonary medicine for consideration of bronchoscopy and biopsy of the cavitary right upper lobe lung mass.  She has already had an appointment with Dr. Shearon Stalls on December 23, 2022 for consideration of this procedure. If the final pathology was consistent with malignancy, I will be  happy to see the patient back for follow-up visit and discussion of her treatment options but if the final pathology was nonmalignant and inflammatory in origin, she will follow-up with her pulmonologist and primary care provider. The patient is in agreement with the current plan. She was advised to call immediately if she has any concerning symptoms in the interval.  The patient voices understanding of current disease status and treatment options and is in agreement with the current care plan.  All questions were answered. The patient knows to call the clinic with any problems, questions or concerns. We can certainly see the patient much sooner if necessary.  Thank you so much for allowing me to  participate in the care of Kathryn Lara. I will continue to follow up the patient with you and assist in her care.  The total time spent in the appointment was 60 minutes.  Disclaimer: This note was dictated with voice recognition software. Similar sounding words can inadvertently be transcribed and may not be corrected upon review.   Eilleen Kempf December 15, 2022, 1:45 PM

## 2022-12-15 NOTE — Progress Notes (Signed)
Today was the pt's initial consult with Dr Julien Nordmann. Navigator introduced herself and explained the role of a nurse navigator. At this time, pt has minimal barriers. Her next appointment is with Pulmonology on 3/6 for which a biopsy will likely be arranged. If the biopsy results come back as cancer, a follow up appointment with Dr.Mohamed will be made. Navigator provided the pt with her direct phone number and encouraged her to call with any questions or concerns.

## 2022-12-23 ENCOUNTER — Ambulatory Visit: Payer: Managed Care, Other (non HMO) | Admitting: Internal Medicine

## 2022-12-23 ENCOUNTER — Encounter: Payer: Self-pay | Admitting: Internal Medicine

## 2022-12-23 VITALS — BP 126/74 | HR 73 | Temp 98.8°F | Ht 64.0 in | Wt 152.2 lb

## 2022-12-23 DIAGNOSIS — J851 Abscess of lung with pneumonia: Secondary | ICD-10-CM | POA: Diagnosis not present

## 2022-12-23 MED ORDER — AMOXICILLIN-POT CLAVULANATE 875-125 MG PO TABS: 1.0000 | ORAL_TABLET | Freq: Two times a day (BID) | ORAL | 0 refills | Status: DC

## 2022-12-23 NOTE — Patient Instructions (Signed)
Please schedule follow up scheduled with myself in 2 months.  If my schedule is not open yet, we will contact you with a reminder closer to that time. Please call [phone] if you haven't heard from [place] a month before.   Before your next visit I would like you to have: CT scan of your lungs in 2 months  We are treating the lesion in your lung as infection. Take augmentin for 4 weeks and then we will check the scan. I will follow up with you after the scan to discuss results and next steps. I am hoping this improves and resolves with treatment.

## 2022-12-23 NOTE — Progress Notes (Signed)
Kathryn Lara    BG:[id number]    1976-03-25  Primary Care Physician:Tysinger, Camelia Eng, PA-C  Referring Physician: Carlena Hurl, PA-C [address] [place],  [place] [address] Reason for Consultation: abnormal PET scan.  Date of Consultation: 12/23/2022  Chief complaint:   Chief Complaint  Patient presents with   Consult    Cough at times     HPI: Kathryn Lara is a 47 y.o. woman who presents for new patient evaluation of abnormal scan of the lungs.  She was diagnosed with Crohn's disease a few years ago but was treating with dietary changes. She more recently has lost 20 lbs of weight with decreased appetite (over the course of year.)Went to GI for work up and had a scan which showed enlarged abdominal lymph nodes. Eventually had pet scan which showed cavitating Right sided nodule.   Has had a cough which started in October of last year. Occasional mucus production. This has gradually improved.   She denies fevers, chills, no hemoptysis. Weight is improving.  No family or personal histroy of lung cancer or other cancers.   No known tuberculosis exposure.   Social history:  Occupation: works as Ecologist in Manufacturing engineer.  Exposures: lives at home with son, daughter and grandson Smoking history: smokes marijuana, rolled. No cigarettes.   Social History   Occupational History   Occupation: Engineer, building services: [employer]  Tobacco Use   Smoking status: Never   Smokeless tobacco: Never  Substance and Sexual Activity   Alcohol use: No    Comment: OCCASIONAL   Drug use: Yes    Types: Marijuana   Sexual activity: Yes    Birth control/protection: None    Relevant family history:  Family History  Problem Relation Age of Onset   Other Father        died of old age   Heart disease Neg Hx    Stroke Neg Hx    Hearing loss Neg Hx    Cancer Neg Hx    Asthma Neg Hx    Diabetes Neg Hx    BRCA 1/2 Neg Hx    Breast cancer  Neg Hx    Lung cancer Neg Hx     Past Medical History:  Diagnosis Date   Anemia    Crohn disease ([place]) 2022   Dr. Juanita Craver   Genital herpes 2023   Headache    Infection    UTI   Infertility, female    Missed abortion    MRSA (methicillin resistant Staphylococcus aureus)    reports +culture in breast ?2008   Tubo-ovarian abscess    Wears glasses     Past Surgical History:  Procedure Laterality Date   CESAREAN SECTION N/A 07/14/2015   Procedure: CESAREAN SECTION;  Surgeon: Everlene Farrier, MD;  Location: [facility];  Service: Obstetrics;  Laterality: N/A;   CYSTOSCOPY  05/04/2014   Procedure: CYSTOSCOPY;  Surgeon: Darlyn Chamber, MD;  Location: [facility];  Service: Gynecology;;   DILATION AND CURETTAGE OF UTERUS N/A 04/17/2014   Procedure: SUCTION DILATATION AND CURETTAGE;  Surgeon: Governor Specking, MD;  Location: [facility];  Service: Gynecology;  Laterality: N/A;   LAPAROTOMY N/A 05/04/2014   Procedure: EXPLORATORY LAPAROTOMY;  Surgeon: Darlyn Chamber, MD;  Location: [facility];  Service: Gynecology;  Laterality: N/A;   OVARIAN EGG RETRIEVAL  03-09-2014   SALPINGOOPHORECTOMY     Right side only   WISDOM  TOOTH EXTRACTION     WOUND EXPLORATION N/A 07/16/2015   Procedure: WOUND EXPLORATION with INCISION REVISION;  Surgeon: Louretta Shorten, MD;  Location: [facility];  Service: Gynecology;  Laterality: N/A;     Physical Exam: Blood pressure 126/74, pulse 73, temperature 98.8 F (37.1 C), temperature source Oral, height '5\' 4"'$  (1.626 m), weight 152 lb 3.2 oz (69 kg), last menstrual period 12/01/2022, SpO2 99 %. Gen:      No acute distress ENT:  no nasal polyps, mucus membranes moist, few fillings, no obvious dental infection Lungs:    No increased respiratory effort, symmetric chest wall excursion, clear to auscultation bilaterally, no wheezes or crackles CV:         Regular rate and rhythm; no murmurs, rubs, or gallops.  No pedal edema Abd:      + bowel sounds; soft, non-tender; no  distension MSK: no acute synovitis of DIP or PIP joints, no mechanics hands.  Skin:      Warm and dry; no rashes Neuro: normal speech, no focal facial asymmetry Psych: alert and oriented x3, normal mood and affect   Data Reviewed/Medical Decision Making:  Independent interpretation of tests: Imaging:  Review of patient's PET CT dotatate scan images Dec 10 2022 revealed right upper lobe cavitating lung lesion. The patient's images have been independently reviewed by me.    PFTs:   Labs:  Lab Results  Component Value Date   NA 138 12/15/2022   K 4.5 12/15/2022   CO2 29 12/15/2022   GLUCOSE 99 12/15/2022   BUN 9 12/15/2022   CREATININE 0.94 12/15/2022   CALCIUM 8.9 12/15/2022   EGFR 75 07/28/2022   GFRNONAA >60 12/15/2022   Lab Results  Component Value Date   WBC 5.1 12/15/2022   HGB 8.7 (L) 12/15/2022   HCT 26.6 (L) 12/15/2022   MCV 85.8 12/15/2022   PLT 276 12/15/2022     Immunization status:  Immunization History  Administered Date(s) Administered   Influenza-Unspecified 08/27/2019   PPD Test 02/17/2021   Tdap 05/08/2015     I reviewed prior external note(s) from oncology, pcp, GI  I reviewed the result(s) of the labs and imaging as noted above.   I have ordered CT Chest  Assessment:  Right upper lobe cavitary lung lesion  Plan/Recommendations:  This RUL cavitary lesion is not dotatate pet avid. It's possible a primary lung cancer may not light up on dotatate pet scan unless it's a carcinoid tumor. However, given her age, low risk for lung cancer based on exposure and personal/family history, I think this is much more likely to be infection than malignancy. There is no significant mediastinal lymphadenopathy on this non contrast study. Will plan to treat with 4 weeks of augmentin and repeat imaging in 8 weeks with a follow up afterwards. We discussed the possibility of biopsy but I think that is premature at this point.   We discussed disease management and  progression at length today.   I spent 45 minutes in the care of this patient today including pre-charting, chart review, review of results, face-to-face care, coordination of care and communication with consultants etc.).    Return to Care: Return in about 2 months (around 02/22/2023).  Lenice Llamas, MD Pulmonary and [place]  CC: Tysinger, Camelia Eng, [place]

## 2023-02-03 ENCOUNTER — Ambulatory Visit: Payer: Managed Care, Other (non HMO) | Admitting: Medical

## 2023-02-17 ENCOUNTER — Other Ambulatory Visit: Payer: Managed Care, Other (non HMO)

## 2023-04-07 ENCOUNTER — Other Ambulatory Visit: Payer: Self-pay

## 2023-11-09 ENCOUNTER — Encounter: Payer: Managed Care, Other (non HMO) | Admitting: Medical

## 2023-12-01 ENCOUNTER — Encounter: Payer: Self-pay | Admitting: Medical

## 2023-12-01 ENCOUNTER — Ambulatory Visit (INDEPENDENT_AMBULATORY_CARE_PROVIDER_SITE_OTHER): Payer: Managed Care, Other (non HMO) | Admitting: Medical

## 2023-12-01 VITALS — BP 110/70 | HR 78 | Ht 64.5 in | Wt 166.8 lb

## 2023-12-01 DIAGNOSIS — D509 Iron deficiency anemia, unspecified: Secondary | ICD-10-CM | POA: Diagnosis not present

## 2023-12-01 DIAGNOSIS — R7989 Other specified abnormal findings of blood chemistry: Secondary | ICD-10-CM | POA: Diagnosis not present

## 2023-12-01 DIAGNOSIS — Z0000 Encounter for general adult medical examination without abnormal findings: Secondary | ICD-10-CM

## 2023-12-01 DIAGNOSIS — Z5941 Food insecurity: Secondary | ICD-10-CM

## 2023-12-01 DIAGNOSIS — Z13220 Encounter for screening for lipoid disorders: Secondary | ICD-10-CM | POA: Diagnosis not present

## 2023-12-01 DIAGNOSIS — Z136 Encounter for screening for cardiovascular disorders: Secondary | ICD-10-CM

## 2023-12-01 DIAGNOSIS — K50919 Crohn's disease, unspecified, with unspecified complications: Secondary | ICD-10-CM

## 2023-12-01 DIAGNOSIS — Z139 Encounter for screening, unspecified: Secondary | ICD-10-CM

## 2023-12-01 DIAGNOSIS — Z599 Problem related to housing and economic circumstances, unspecified: Secondary | ICD-10-CM

## 2023-12-01 MED ORDER — TANDEM PLUS 162-115.2-1 MG PO CAPS: 1.0000 | ORAL_CAPSULE | Freq: Every day | ORAL | 0 refills | Status: AC

## 2023-12-01 NOTE — Progress Notes (Signed)
Subjective:   HPI  Kathryn Lara is a 48 y.o. female who presents for Chief Complaint  Patient presents with   Annual Exam    Cpe, had peanuts and lifesaver at 11:30am. Declines vaccines    Patient Care Team: Jceon Alverio, Kermit Balo, PA-C as PCP - General (Family Medicine) Mitchel Honour, DO as Consulting Physician (Obstetrics and Gynecology) Sees dentist Sees eye doctor Dr. Charna Elizabeth, GI   Concerns: Here for a physical   No recent issues  Reviewed their medical, surgical, family, social, medication, and allergy history and updated chart as appropriate.  Past Medical History:  Diagnosis Date   Anemia    Crohn disease (HCC) 2022   Dr. Charna Elizabeth   Genital herpes 2023   Headache    Infection    UTI   Infertility, female    Missed abortion    MRSA (methicillin resistant Staphylococcus aureus)    reports +culture in breast ?2008   Tubo-ovarian abscess    Wears glasses     Family History  Problem Relation Age of Onset   Other Father        died of old age   Heart disease Neg Hx    Stroke Neg Hx    Hearing loss Neg Hx    Cancer Neg Hx    Asthma Neg Hx    Diabetes Neg Hx    BRCA 1/2 Neg Hx    Breast cancer Neg Hx    Lung cancer Neg Hx      Current Outpatient Medications:    FeFum-FePo-FA-B Cmp-C-Zn-Mn-Cu (TANDEM PLUS) 162-115.2-1 MG CAPS, Take 1 tablet by mouth daily., Disp: 90 capsule, Rfl: 0   venlafaxine (EFFEXOR) 37.5 MG tablet, Take 37.5 mg by mouth 2 (two) times daily., Disp: , Rfl:   Allergies  Allergen Reactions   Sulfa Antibiotics Hives   Review of Systems  Constitutional:  Negative for chills, fever, malaise/fatigue and weight loss.  HENT:  Negative for congestion, ear pain, hearing loss, sore throat and tinnitus.   Eyes:  Negative for blurred vision, pain and redness.  Respiratory:  Negative for cough, hemoptysis and shortness of breath.   Cardiovascular:  Negative for chest pain, palpitations, orthopnea, claudication and leg swelling.   Gastrointestinal:  Negative for abdominal pain, blood in stool, constipation, diarrhea, nausea and vomiting.  Genitourinary:  Negative for dysuria, flank pain, frequency, hematuria and urgency.  Musculoskeletal:  Negative for falls, joint pain and myalgias.  Skin:  Negative for itching and rash.  Neurological:  Negative for dizziness, tingling, speech change, weakness and headaches.  Endo/Heme/Allergies:  Negative for polydipsia. Does not bruise/bleed easily.  Psychiatric/Behavioral:  Negative for depression and memory loss. The patient is not nervous/anxious and does not have insomnia.          12/01/2023    3:07 PM 11/03/2022   12:10 PM 11/03/2022   11:29 AM 07/28/2022    8:41 AM  Depression screen PHQ 2/9  Decreased Interest 0 1 0 1  Down, Depressed, Hopeless 1 1 0 1  PHQ - 2 Score 1 2 0 2  Altered sleeping  2  1  Tired, decreased energy  2  3  Change in appetite  0  3  Feeling bad or failure about yourself   1  1  Trouble concentrating  3  1  Moving slowly or fidgety/restless  0  1  Suicidal thoughts  0  0  PHQ-9 Score  10  12  Difficult doing work/chores  Somewhat difficult  Somewhat difficult       Objective:  BP 110/70   Pulse 78   Ht 5' 4.5" (1.638 m)   Wt 166 lb 12.8 oz (75.7 kg)   LMP 11/01/2023   SpO2 98%   BMI 28.19 kg/m   General appearance: alert, no distress, WD/WN, African American female Skin: unremarkable, tattoo left lateral neck HEENT: normocephalic, conjunctiva/corneas normal, sclerae anicteric, PERRLA, EOMi, nares patent, no discharge or erythema, pharynx normal Oral cavity: MMM, tongue normal, teeth normal Neck: supple, no lymphadenopathy, no thyromegaly, no masses, normal ROM, no bruits Chest: non tender, normal shape and expansion Heart: RRR, normal S1, S2, no murmurs Lungs: CTA bilaterally, no wheezes, rhonchi, or rales Abdomen: +bs, soft, non tender, non distended, no masses, no hepatomegaly, no splenomegaly, no bruits Back: non tender,  normal ROM, no scoliosis Musculoskeletal: upper extremities non tender, no obvious deformity, normal ROM throughout, lower extremities non tender, no obvious deformity, normal ROM throughout Extremities: no edema, no cyanosis, no clubbing Pulses: 2+ symmetric, upper and lower extremities, normal cap refill Neurological: alert, oriented x 3, CN2-12 intact, strength normal upper extremities and lower extremities, sensation normal throughout, DTRs 2+ throughout, no cerebellar signs, gait normal Psychiatric: normal affect, behavior normal, pleasant  Breast/gyn/rectal - deferred to gynecology     Assessment and Plan :   Encounter Diagnoses  Name Primary?   Encounter for health maintenance examination in adult Yes   Iron deficiency anemia, unspecified iron deficiency anemia type    Abnormal thyroid blood test    Screening for lipid disorders    Crohn's disease with complication, unspecified gastrointestinal tract location [facility])    Encounter for lipid screening for cardiovascular disease      This visit was a preventative care visit, also known as wellness visit or routine physical.   Topics typically include healthy lifestyle, diet, exercise, preventative care, vaccinations, sick and well care, proper use of emergency dept and after hours care, as well as other concerns.     Recommendations: Continue to return yearly for your annual wellness and preventative care visits.  This gives [place] a chance to discuss healthy lifestyle, exercise, vaccinations, review your chart record, and perform screenings where appropriate.  I recommend you see your eye doctor yearly for routine vision care.  I recommend you see your dentist yearly for routine dental care including hygiene visits twice yearly.  See your gynecologist yearly for routine gynecological care.   Vaccination recommendations were reviewed Immunization History  Administered Date(s) Administered   Influenza-Unspecified 08/27/2019   PPD  Test 02/17/2021   Tdap 05/08/2015   Declines flu vaccine   Screening for cancer: Colon cancer screening: I reviewed your colonoscopy on file that is up to date from 2020  Breast cancer screening: You should perform a self breast exam monthly.   We reviewed recommendations for regular mammograms and breast cancer screening.  Cervical cancer screening: We reviewed recommendations for pap smear screening.  We will request recent pap smear from gyn   Skin cancer screening: Check your skin regularly for new changes, growing lesions, or other lesions of concern Come in for evaluation if you have skin lesions of concern.  Lung cancer screening: If you have a greater than 20 pack year history of tobacco use, then you may qualify for lung cancer screening with a chest CT scan.   Please call your insurance company to inquire about coverage for this test.  We currently don't have screenings for other cancers besides breast, cervical, colon, and lung  cancers.  If you have a strong family history of cancer or have other cancer screening concerns, please let me know.    Bone health: Get at least 150 minutes of aerobic exercise weekly Get weight bearing exercise at least once weekly Bone density test:  A bone density test is an imaging test that uses a type of X-ray to measure the amount of calcium and other minerals in your bones. The test may be used to diagnose or screen you for a condition that causes weak or thin bones (osteoporosis), predict your risk for a broken bone (fracture), or determine how well your osteoporosis treatment is working. The bone density test is recommended for females 65 and older, or females or males <65 if certain risk factors such as thyroid disease, long term use of steroids such as for asthma or rheumatological issues, vitamin D deficiency, estrogen deficiency, family history of osteoporosis, self or family history of fragility fracture in first degree  relative.    Heart health: Get at least 150 minutes of aerobic exercise weekly Limit alcohol It is important to maintain a healthy blood pressure and healthy cholesterol numbers  Heart disease screening: Screening for heart disease includes screening for blood pressure, fasting lipids, glucose/diabetes screening, BMI height to weight ratio, reviewed of smoking status, physical activity, and diet.    Goals include blood pressure 120/80 or less, maintaining a healthy lipid/cholesterol profile, preventing diabetes or keeping diabetes numbers under good control, not smoking or using tobacco products, exercising most days per week or at least 150 minutes per week of exercise, and eating healthy variety of fruits and vegetables, healthy oils, and avoiding unhealthy food choices like fried food, fast food, high sugar and high cholesterol foods.    Other tests may possibly include EKG test, CT coronary calcium score, echocardiogram, exercise treadmill stress test.    Medical care options: I recommend you continue to seek care here first for routine care.  We try really hard to have available appointments Monday through Friday daytime hours for sick visits, acute visits, and physicals.  Urgent care should be used for after hours and weekends for significant issues that cannot wait till the next day.  The emergency department should be used for significant potentially life-threatening emergencies.  The emergency department is expensive, can often have long wait times for less significant concerns, so try to utilize primary care, urgent care, or telemedicine when possible to avoid unnecessary trips to the emergency department.  Virtual visits and telemedicine have been introduced since the pandemic started in 2020, and can be convenient ways to receive medical care.  We offer virtual appointments as well to assist you in a variety of options to seek medical care.   Advanced Directives: I recommend you  consider completing a Health Care Power of Attorney and Living Will.   These documents respect your wishes and help alleviate burdens on your loved ones if you were to become terminally ill or be in a position to need those documents enforced.    You can complete Advanced Directives yourself, have them notarized, then have copies made for our office, for you and for anybody you feel should have them in safe keeping.  Or, you can have an attorney prepare these documents.   If you haven't updated your Last Will and Testament in a while, it may be worthwhile having an attorney prepare these documents together and save on some costs.      Separate significant issues discussed: Crohn's disease-managed by  gastroenterology  Iron deficiency anemia-update labs today, begin Tandem. She doesn't tolerate fergon or generic iron due to constipation, upset stomach  She had an unusual finding in lungs last year, ended up seeing oncology, pulmonology, had multiple imaging.  She last had 1 moth of antibiotic treatment about a year ago.  No current symptoms of concern. She decided not to go back for repeat scan last year after treatment.  Tessah was seen today for annual exam.  Diagnoses and all orders for this visit:  Encounter for health maintenance examination in adult -     Comprehensive metabolic panel -     CBC with Differential/Platelet -     TSH -     T4, free -     Lipid panel -     Iron, TIBC and Ferritin Panel  Iron deficiency anemia, unspecified iron deficiency anemia type -     CBC with Differential/Platelet -     Iron, TIBC and Ferritin Panel  Abnormal thyroid blood test -     TSH -     T4, free  Screening for lipid disorders -     Lipid panel  Crohn's disease with complication, unspecified gastrointestinal tract location (HCC)  Encounter for lipid screening for cardiovascular disease  Other orders -     FeFum-FePo-FA-B Cmp-C-Zn-Mn-Cu (TANDEM PLUS) 162-115.2-1 MG CAPS; Take 1  tablet by mouth daily.    Follow-up pending labs, yearly for physical

## 2023-12-01 NOTE — Addendum Note (Signed)
Addended by: Herminio Commons A on: 12/01/2023 04:08 PM   Modules accepted: Orders

## 2023-12-02 LAB — CBC WITH DIFFERENTIAL/PLATELET
Basophils Absolute: 0 10*3/uL (ref 0.0–0.2)
Basos: 1 %
EOS (ABSOLUTE): 0.3 10*3/uL (ref 0.0–0.4)
Eos: 5 %
Hematocrit: 32.4 % — ABNORMAL LOW (ref 34.0–46.6)
Hemoglobin: 9.8 g/dL — ABNORMAL LOW (ref 11.1–15.9)
Immature Grans (Abs): 0 10*3/uL (ref 0.0–0.1)
Immature Granulocytes: 0 %
Lymphocytes Absolute: 1.7 10*3/uL (ref 0.7–3.1)
Lymphs: 29 %
MCH: 25.5 pg — ABNORMAL LOW (ref 26.6–33.0)
MCHC: 30.2 g/dL — ABNORMAL LOW (ref 31.5–35.7)
MCV: 84 fL (ref 79–97)
Monocytes Absolute: 0.5 10*3/uL (ref 0.1–0.9)
Monocytes: 9 %
Neutrophils Absolute: 3.4 10*3/uL (ref 1.4–7.0)
Neutrophils: 56 %
Platelets: 282 10*3/uL (ref 150–450)
RBC: 3.85 x10E6/uL (ref 3.77–5.28)
RDW: 15.8 % — ABNORMAL HIGH (ref 11.7–15.4)
WBC: 5.9 10*3/uL (ref 3.4–10.8)

## 2023-12-02 LAB — COMPREHENSIVE METABOLIC PANEL
ALT: 10 [IU]/L (ref 0–32)
AST: 12 [IU]/L (ref 0–40)
Albumin: 4.3 g/dL (ref 3.9–4.9)
Alkaline Phosphatase: 68 [IU]/L (ref 44–121)
BUN/Creatinine Ratio: 17 (ref 9–23)
BUN: 14 mg/dL (ref 6–24)
Bilirubin Total: 0.3 mg/dL (ref 0.0–1.2)
CO2: 22 mmol/L (ref 20–29)
Calcium: 9.5 mg/dL (ref 8.7–10.2)
Chloride: 104 mmol/L (ref 96–106)
Creatinine, Ser: 0.82 mg/dL (ref 0.57–1.00)
Globulin, Total: 2.7 g/dL (ref 1.5–4.5)
Glucose: 80 mg/dL (ref 70–99)
Potassium: 4.4 mmol/L (ref 3.5–5.2)
Sodium: 137 mmol/L (ref 134–144)
Total Protein: 7 g/dL (ref 6.0–8.5)
eGFR: 88 mL/min/{1.73_m2} (ref >59)

## 2023-12-02 LAB — IRON,TIBC AND FERRITIN PANEL
Ferritin: 12 ng/mL — ABNORMAL LOW (ref 15–150)
Iron Saturation: 12 % — ABNORMAL LOW (ref 15–55)
Iron: 53 ug/dL (ref 27–159)
Total Iron Binding Capacity: 458 ug/dL — ABNORMAL HIGH (ref 250–450)
UIBC: 405 ug/dL (ref 131–425)

## 2023-12-02 LAB — LIPID PANEL
Chol/HDL Ratio: 2.3 {ratio} (ref 0.0–4.4)
Cholesterol, Total: 156 mg/dL (ref 100–199)
HDL: 69 mg/dL (ref >39)
LDL Chol Calc (NIH): 76 mg/dL (ref 0–99)
Triglycerides: 52 mg/dL (ref 0–149)
VLDL Cholesterol Cal: 11 mg/dL (ref 5–40)

## 2023-12-02 LAB — T4, FREE: Free T4: 1.37 ng/dL (ref 0.82–1.77)

## 2023-12-02 LAB — TSH: TSH: 0.706 u[IU]/mL (ref 0.450–4.500)

## 2023-12-02 NOTE — Progress Notes (Signed)
As you predicted, hemoglobin is low and iron is low.  Liver kidney electrolytes normal, thyroid normal, cholesterol normal  I recommend trying the polysaccharide iron I sent to the pharmacy yesterday.  Lets see if we can get this covered by insurance.  I think you will tolerate this pretty good.  I would take this daily indefinitely.  If for some reason it is not covered by insurance then I would recommend a prenatal vitamin daily over-the-counter which has extra iron in it.  Also if desired, we could set you up for quarterly iron infusions to help boost the iron a little better.  If you are having heavy periods I recommend you talk to your gynecologist about ways to improve on the heavy periods

## 2023-12-03 ENCOUNTER — Telehealth: Payer: Self-pay | Admitting: *Deleted

## 2023-12-03 NOTE — Progress Notes (Signed)
Complex Care Management Note Care Guide Note  12/03/2023 Name: Kathryn Lara MRN: [id number] DOB: [birth year]   Complex Care Management Outreach Attempts: An unsuccessful telephone outreach was attempted today to offer the patient information about available complex care management services.  Follow Up Plan:  Additional outreach attempts will be made to offer the patient complex care management information and services.   Encounter Outcome:  No Answer  [name]  [facility]  [facility] Guide  Direct Dial: [phone]  Fax [phone]

## 2023-12-08 NOTE — Progress Notes (Unsigned)
Complex Care Management Note Care Guide Note  12/08/2023 Name: Kathryn Lara MRN: [id number] DOB: [birth year]   Complex Care Management Outreach Attempts: A second unsuccessful outreach was attempted today to offer the patient with information about available complex care management services.  Follow Up Plan:  Additional outreach attempts will be made to offer the patient complex care management information and services.   Encounter Outcome:  No Answer  [name]  [facility]  [facility] Guide  Direct Dial: [phone]  Fax [phone]

## 2023-12-09 NOTE — Progress Notes (Signed)
Complex Care Management Note Care Guide Note  12/09/2023 Name: Kathryn Lara MRN: [id number] DOB: 09/13/1976   Complex Care Management Outreach Attempts: A third unsuccessful outreach was attempted today to offer the patient with information about available complex care management services.  Follow Up Plan:  No further outreach attempts will be made at this time. We have been unable to contact the patient to offer or enroll patient in complex care management services.  Encounter Outcome:  Patient states she will call back when she is ready to schedule. Closing referral.  [name]  [facility]  [facility], [facility] Guide  Direct Dial: [phone]  Fax [phone]

## 2024-12-05 ENCOUNTER — Encounter: Payer: Managed Care, Other (non HMO) | Admitting: Medical
